# Patient Record
Sex: Female | Born: 1985 | Race: White | Hispanic: No | Marital: Single | Smoking: Current every day smoker
Health system: Southern US, Community
[De-identification: ages and names within clinical notes are randomized; demographics above are authoritative.]

## PROBLEM LIST (undated history)

## (undated) DIAGNOSIS — F909 Attention-deficit hyperactivity disorder, unspecified type: Secondary | ICD-10-CM

## (undated) DIAGNOSIS — F419 Anxiety disorder, unspecified: Secondary | ICD-10-CM

## (undated) DIAGNOSIS — F319 Bipolar disorder, unspecified: Secondary | ICD-10-CM

## (undated) DIAGNOSIS — J45909 Unspecified asthma, uncomplicated: Secondary | ICD-10-CM

## (undated) DIAGNOSIS — F1910 Other psychoactive substance abuse, uncomplicated: Secondary | ICD-10-CM

## (undated) DIAGNOSIS — E7800 Pure hypercholesterolemia, unspecified: Secondary | ICD-10-CM

## (undated) DIAGNOSIS — S149XXA Injury of unspecified nerves of neck, initial encounter: Secondary | ICD-10-CM

## (undated) DIAGNOSIS — G589 Mononeuropathy, unspecified: Secondary | ICD-10-CM

## (undated) HISTORY — DX: Pure hypercholesterolemia, unspecified: E78.00

## (undated) HISTORY — PX: ADENOIDECTOMY: SUR15

## (undated) HISTORY — PX: TUBAL LIGATION: SHX77

## (undated) HISTORY — PX: TONSILLECTOMY: SUR1361

## (undated) HISTORY — DX: Unspecified asthma, uncomplicated: J45.909

## (undated) HISTORY — PX: CYSTECTOMY: SHX5119

## (undated) HISTORY — PX: WISDOM TOOTH EXTRACTION: SHX21

---

## 2016-09-14 ENCOUNTER — Emergency Department (HOSPITAL_COMMUNITY)
Admission: EM | Admit: 2016-09-14 | Discharge: 2016-09-14 | Disposition: A | Discharge status: Home / Self Care | Payer: Self-pay | Attending: Emergency Medicine | Admitting: Emergency Medicine

## 2016-09-14 ENCOUNTER — Encounter (HOSPITAL_COMMUNITY): Payer: Self-pay | Admitting: *Deleted

## 2016-09-14 DIAGNOSIS — Y929 Unspecified place or not applicable: Secondary | ICD-10-CM | POA: Insufficient documentation

## 2016-09-14 DIAGNOSIS — Y9389 Activity, other specified: Secondary | ICD-10-CM | POA: Insufficient documentation

## 2016-09-14 DIAGNOSIS — Y999 Unspecified external cause status: Secondary | ICD-10-CM | POA: Insufficient documentation

## 2016-09-14 DIAGNOSIS — S39012A Strain of muscle, fascia and tendon of lower back, initial encounter: Secondary | ICD-10-CM | POA: Insufficient documentation

## 2016-09-14 DIAGNOSIS — X500XXA Overexertion from strenuous movement or load, initial encounter: Secondary | ICD-10-CM | POA: Insufficient documentation

## 2016-09-14 MED ORDER — HYDROCODONE-ACETAMINOPHEN 5-325 MG PO TABS: 2.0000 | ORAL_TABLET | ORAL | 0 refills | Status: DC | PRN

## 2016-09-14 MED ORDER — CYCLOBENZAPRINE HCL 10 MG PO TABS: 10.0000 mg | ORAL_TABLET | Freq: Every evening | ORAL | 0 refills | Status: DC | PRN

## 2016-09-14 NOTE — Discharge Instructions (Signed)
Rest Try a heating pad Take Ibuprofen 800mg  three times daily for the next week Pain meds and muscle relaxer will make you drowsy. Do not take these medicines and drive or go to work

## 2016-09-14 NOTE — ED Provider Notes (Signed)
[facility]-EMERGENCY DEPT Provider Note   CSN: [id number] Arrival date & time: 09/14/16  1824  By signing my name below, I, Soijett Blue, attest that this documentation has been prepared under the direction and in the presence of Bethel BornKelly Marie Kleo Dungee, PA-C Electronically Signed: Soijett Blue, ED Scribe. 09/14/16. 7:25 PM.   History   Chief Complaint Chief Complaint  Patient presents with  . Back Pain    HPI Sandra Garza is a 30 y.o. female who presents to the Emergency Department complaining of sudden onset, non-radiating, lower back pain onset this morning. Pt notes that she was lifting heavy boxes while moving and she believes that this is the cause of her lower back pain. Pt denies having lower back pain in the past. Pt notes that her lower back pain is worsened with position change, standing, and ambulation. Pt denies alleviating factors for her lower back pain. Pt states that she had neck issues with recommended surgery that she put off due to moving. Pt denies recent falls. She states that she has tried 800 mg ibuprofen with no relief for her symptoms. Pt denies difficulty urinating, numbness, tingling, saddle paresthesia, bowel/bladder incontinence, fever, chills, color change, rash, wound, and any other symptoms.    The history is provided by the patient. No language interpreter was used.    History reviewed. No pertinent past medical history.  There are no active problems to display for this patient.   History reviewed. No pertinent surgical history.  OB History    No data available       Home Medications    Prior to Admission medications   Not on File    Family History No family history on file.  Social History Social History  Substance Use Topics  . Smoking status: Never Smoker  . Smokeless tobacco: Never Used  . Alcohol use No     Allergies   Patient has no allergy information on record.   Review of Systems Review of Systems  Constitutional: Negative  for chills and fever.  Gastrointestinal:       No bowel incontinence  Genitourinary: Negative for difficulty urinating.       No bladder incontinence  Musculoskeletal: Positive for back pain (lower). Negative for gait problem and joint swelling.  Skin: Negative for color change, rash and wound.  Neurological: Negative for numbness.       No tingling     Physical Exam Updated Vital Signs BP 110/79 (BP Location: Right Arm)   Pulse 79   Temp 98.2 F (36.8 C) (Oral)   Resp 18   LMP 09/10/2016   SpO2 95%   Physical Exam  Constitutional: She is oriented to person, place, and time. She appears well-developed and well-nourished. No distress.  HENT:  Head: Normocephalic and atraumatic.  Eyes: EOM are normal.  Neck: Neck supple.  Cardiovascular: Normal rate.   Pulmonary/Chest: Effort normal. No respiratory distress.  Abdominal: She exhibits no distension.  Musculoskeletal: Normal range of motion.       Lumbar back: She exhibits tenderness and bony tenderness.  Inspection: No masses, deformity, or rash Palpation: No cervical or thoracic midline spinal tenderness. No cervical or thoracic paraspinal muscle tenderness. Diffuse lumbar tenderness, midline and paraspinal muscle tenderness.  Strength: 5/5 in lower extremities and normal plantar and dorsiflexion Gait: Normal gait Reflexes: Patellar reflex is 2+ on left and 1+ on right, which pt states is normal for her. Achilles is 2+ bilaterally  Neurological: She is alert and oriented to person, place,  and time.  Skin: Skin is warm and dry.  Psychiatric: She has a normal mood and affect. Her behavior is normal.  Nursing note and vitals reviewed.    ED Treatments / Results  DIAGNOSTIC STUDIES: Oxygen Saturation is 95% on RA, adequate by my interpretation.    COORDINATION OF CARE: 7:18 PM Discussed treatment plan with pt at bedside which includes norco Rx, flexeril Rx and pt agreed to plan.   Procedures Procedures (including  critical care time)  Medications Ordered in ED Medications - No data to display   Initial Impression / Assessment and Plan / ED Course  I have reviewed the triage vital signs and the nursing notes.  Clinical Course    Patient with back pain.  No neurological deficits and normal neuro exam.  Patient is ambulatory.  No loss of bowel or bladder control.  No concern for cauda equina.  No fever, night sweats, weight loss, h/o cancer, IVDA, no recent procedure to back. No urinary symptoms suggestive of UTI. Pt will be discharged home with norco Rx and flexeril Rx. Supportive care and return precaution discussed. Appears safe for discharge at this time. Follow up as indicated in discharge paperwork.   Final Clinical Impressions(s) / ED Diagnoses   Final diagnoses:  Strain of lumbar region, initial encounter    New Prescriptions Discharge Medication List as of 09/14/2016  7:21 PM    START taking these medications   Details  cyclobenzaprine (FLEXERIL) 10 MG tablet Take 1 tablet (10 mg total) by mouth at bedtime and may repeat dose one time if needed., Starting Tue 09/14/2016, Print    HYDROcodone-acetaminophen (NORCO/VICODIN) 5-325 MG tablet Take 2 tablets by mouth every 4 (four) hours as needed., Starting Tue 09/14/2016, Print       I personally performed the services described in this documentation, which was scribed in my presence. The recorded information has been reviewed and is accurate.     Bethel BornKelly Marie Lillionna Nabi, PA-C 09/15/16 1124    Donnetta HutchingBrian Cook, MD 09/17/16 [phone]

## 2016-09-14 NOTE — ED Triage Notes (Signed)
Pt complains of lower back pain since waking up this morning. Pt states she recently moved and has been moving boxes since Friday. Pt denies urinary symptoms.

## 2016-09-27 ENCOUNTER — Encounter (HOSPITAL_COMMUNITY): Payer: Self-pay | Admitting: Emergency Medicine

## 2016-09-27 ENCOUNTER — Emergency Department (HOSPITAL_COMMUNITY)
Admission: EM | Admit: 2016-09-27 | Discharge: 2016-09-27 | Disposition: A | Discharge status: Home / Self Care | Payer: Self-pay | Attending: Emergency Medicine | Admitting: Emergency Medicine

## 2016-09-27 DIAGNOSIS — F909 Attention-deficit hyperactivity disorder, unspecified type: Secondary | ICD-10-CM | POA: Insufficient documentation

## 2016-09-27 DIAGNOSIS — Z760 Encounter for issue of repeat prescription: Secondary | ICD-10-CM | POA: Insufficient documentation

## 2016-09-27 DIAGNOSIS — Z79899 Other long term (current) drug therapy: Secondary | ICD-10-CM | POA: Insufficient documentation

## 2016-09-27 DIAGNOSIS — F17200 Nicotine dependence, unspecified, uncomplicated: Secondary | ICD-10-CM | POA: Insufficient documentation

## 2016-09-27 HISTORY — DX: Attention-deficit hyperactivity disorder, unspecified type: F90.9

## 2016-09-27 HISTORY — DX: Bipolar disorder, unspecified: F31.9

## 2016-09-27 HISTORY — DX: Injury of unspecified nerves of neck, initial encounter: S14.9XXA

## 2016-09-27 HISTORY — DX: Anxiety disorder, unspecified: F41.9

## 2016-09-27 HISTORY — DX: Mononeuropathy, unspecified: G58.9

## 2016-09-27 NOTE — ED Triage Notes (Signed)
Pt stated that she ran out of her behavioral health medications yesterday

## 2016-09-27 NOTE — Discharge Instructions (Signed)
[facility] for help with establishing care with a new psychiatrist. Please return to the emergency department if you develop any new or worsening symptoms.

## 2016-09-27 NOTE — ED Provider Notes (Signed)
[facility]PT Provider Note   CSN: [id number] Arrival date & time: 09/27/16  [id number]     History   Chief Complaint Chief Complaint  Patient presents with  . Medication Refill    HPI Sandra Garza is a 30 y.o. female with history of ADHD, bipolar 1, anxiety who presents for medication refill of her psychiatric medications. Patient reports she recently moved from [place] and does not have insurance in [place] yet. Patient reports that her psychiatrist in [place] can only prescribed 3 of her medications across state lines, and they are not the most important medications to keep her level. Patient ran out of her medications yesterday. She has a symptomatic. She denies SI, HI, AVH. Patient also denies any chest pain, shortness of breath, abdominal pain, nausea, vomiting, urinary symptoms.  HPI  Past Medical History:  Diagnosis Date  . ADHD   . Anxiety   . Bipolar 1 disorder, depressed (HCC)   . Pinched nerve in neck    C 6-7    There are no active problems to display for this patient.   Past Surgical History:  Procedure Laterality Date  . CESAREAN SECTION    . CYSTECTOMY     on overy  . TUBAL LIGATION      OB History    No data available       Home Medications    Prior to Admission medications   Medication Sig Start Date End Date Taking? Authorizing Provider  ALPRAZolam Prudy Feeler(XANAX) 1 MG tablet Take 1 mg by mouth 2 (two) times daily as needed for anxiety.   Yes Historical Provider, MD  amphetamine-dextroamphetamine (ADDERALL XR) 20 MG 24 hr capsule Take 40 mg by mouth daily.   Yes Historical Provider, MD  ARIPiprazole (ABILIFY) 5 MG tablet Take 5 mg by mouth daily.   Yes Historical Provider, MD  cyclobenzaprine (FLEXERIL) 10 MG tablet Take 1 tablet (10 mg total) by mouth at bedtime and may repeat dose one time if needed. 09/14/16  Yes Bethel BornKelly Marie Gekas, PA-C  FLUoxetine (PROZAC) 20 MG tablet Take 20 mg by mouth daily.   Yes Historical Provider, MD  topiramate (TOPAMAX)  15 MG capsule Take 15 mg by mouth 2 (two) times daily.   Yes Historical Provider, MD  HYDROcodone-acetaminophen (NORCO/VICODIN) 5-325 MG tablet Take 2 tablets by mouth every 4 (four) hours as needed. Patient not taking: Reported on 09/27/2016 09/14/16   Bethel BornKelly Marie Gekas, PA-C    Family History History reviewed. No pertinent family history.  Social History Social History  Substance Use Topics  . Smoking status: Current Every Day Smoker    Packs/day: 0.50  . Smokeless tobacco: Never Used  . Alcohol use Yes     Allergies   Ceclor [cefaclor]   Review of Systems Review of Systems  Constitutional: Negative for chills and fever.  HENT: Negative for facial swelling and sore throat.   Respiratory: Negative for shortness of breath.   Cardiovascular: Negative for chest pain.  Gastrointestinal: Negative for abdominal pain, nausea and vomiting.  Genitourinary: Negative for dysuria.  Musculoskeletal: Negative for back pain.  Skin: Negative for rash and wound.  Neurological: Negative for headaches.  Psychiatric/Behavioral: Negative for hallucinations and suicidal ideas. The patient is not nervous/anxious.      Physical Exam Updated Vital Signs BP 111/82 (BP Location: Right Arm)   Pulse 87   Temp 98.2 F (36.8 C) (Oral)   Resp 18   Wt 104.3 kg   LMP 09/10/2016   SpO2 93%  Physical Exam  Constitutional: She appears well-developed and well-nourished. No distress.  HENT:  Head: Normocephalic and atraumatic.  Mouth/Throat: Oropharynx is clear and moist. No oropharyngeal exudate.  Eyes: Conjunctivae are normal. Pupils are equal, round, and reactive to light. Right eye exhibits no discharge. Left eye exhibits no discharge. No scleral icterus.  Neck: Normal range of motion. Neck supple. No thyromegaly present.  Cardiovascular: Normal rate, regular rhythm, normal heart sounds and intact distal pulses.  Exam reveals no gallop and no friction rub.   No murmur heard. Pulmonary/Chest:  Effort normal and breath sounds normal. No stridor. No respiratory distress. She has no wheezes. She has no rales.  Abdominal: Soft. Bowel sounds are normal. She exhibits no distension. There is no tenderness. There is no rebound and no guarding.  Musculoskeletal: She exhibits no edema.  Lymphadenopathy:    She has no cervical adenopathy.  Neurological: She is alert. Coordination normal.  Skin: Skin is warm and dry. No rash noted. She is not diaphoretic. No pallor.  Psychiatric: She has a normal mood and affect.  Nursing note and vitals reviewed.    ED Treatments / Results  Labs (all labs ordered are listed, but only abnormal results are displayed) Labs Reviewed - No data to display  EKG  EKG Interpretation None       Radiology No results found.  Procedures Procedures (including critical care time)  Medications Ordered in ED Medications - No data to display   Initial Impression / Assessment and Plan / ED Course  I have reviewed the triage vital signs and the nursing notes.  Pertinent labs & imaging results that were available during my care of the patient were reviewed by me and considered in my medical decision making (see chart for details).  Clinical Course     Patient presented for medication refill. Patient is symptomatic. Considering these medications are psychiatric and many are controlled substances, I told patient that we cannot refill them in the ED. I gave the patient resources for behavioral health in the area. She understands and agrees with plan. Patient vitals stable throughout ED course and discharged in satisfactory condition. I discussed patient case with Dr. Freida BusmanAllen who guided the patient's management and agrees with plan.  Final Clinical Impressions(s) / ED Diagnoses   Final diagnoses:  Encounter for medication refill    New Prescriptions New Prescriptions   No medications on file     Emi Holeslexandra M Llewelyn Sheaffer, Cordelia Poche-C 09/27/16 1148    Lorre NickAnthony Allen,  MD 09/29/16 1022

## 2016-12-15 ENCOUNTER — Encounter (HOSPITAL_COMMUNITY): Payer: Self-pay | Admitting: Emergency Medicine

## 2016-12-15 ENCOUNTER — Emergency Department (HOSPITAL_COMMUNITY)
Admission: EM | Admit: 2016-12-15 | Discharge: 2016-12-15 | Disposition: A | Discharge status: Home / Self Care | Payer: Medicaid Other | Attending: Emergency Medicine | Admitting: Emergency Medicine

## 2016-12-15 DIAGNOSIS — F17200 Nicotine dependence, unspecified, uncomplicated: Secondary | ICD-10-CM | POA: Insufficient documentation

## 2016-12-15 DIAGNOSIS — M62838 Other muscle spasm: Secondary | ICD-10-CM | POA: Insufficient documentation

## 2016-12-15 DIAGNOSIS — F909 Attention-deficit hyperactivity disorder, unspecified type: Secondary | ICD-10-CM | POA: Diagnosis not present

## 2016-12-15 DIAGNOSIS — M542 Cervicalgia: Secondary | ICD-10-CM | POA: Diagnosis present

## 2016-12-15 MED ORDER — KETOROLAC TROMETHAMINE 30 MG/ML IJ SOLN
30.0000 mg | Freq: Once | INTRAMUSCULAR | Status: AC
  Administered 2016-12-15: 30 mg via INTRAMUSCULAR
  Filled 2016-12-15: qty 1

## 2016-12-15 MED ORDER — NAPROXEN 500 MG PO TABS: 500.0000 mg | ORAL_TABLET | Freq: Two times a day (BID) | ORAL | 0 refills | Status: DC

## 2016-12-15 MED ORDER — DIAZEPAM 2 MG PO TABS
2.0000 mg | ORAL_TABLET | Freq: Once | ORAL | Status: AC
  Administered 2016-12-15: 2 mg via ORAL
  Filled 2016-12-15: qty 1

## 2016-12-15 NOTE — ED Notes (Signed)
ED Provider at bedside. 

## 2016-12-15 NOTE — ED Provider Notes (Signed)
[facility]PT Provider Note   CSN: [id number] Arrival date & time: 12/15/16  1902   By signing my name below, I, Clarisse Gouge, attest that this documentation has been prepared under the direction and in the presence of Milly Jakob, PA-C. Electronically Signed: Clarisse Gouge, Scribe. 12/15/16. 9:39 PM.   History   Chief Complaint Chief Complaint  Patient presents with  . Neck Pain   The history is provided by the patient and medical records. No language interpreter was used.    HPI Comments: Sandra Garza is a 31 y.o. female who presents to the Emergency Department complaining of progressively worsening right neck pain  x 4 days. She states the neck pain is causing a headache and she adds associated bilateral forearm tingling. Pt reports Hx of a pinched nerve at C6 or C7. She states she was seen at a UC ~2-3 days prior to evaluation, where she was given robaxin for her pain without relief. Pt denies injury to the neck, fever, numbness, weakness, head injury, SOB, nausea, vomiting and diarrhea. No PCP noted.  Past Medical History:  Diagnosis Date  . ADHD   . Anxiety   . Bipolar 1 disorder, depressed (HCC)   . Pinched nerve in neck    C 6-7    There are no active problems to display for this patient.   Past Surgical History:  Procedure Laterality Date  . CESAREAN SECTION    . CYSTECTOMY     on overy  . TUBAL LIGATION      OB History    No data available       Home Medications    Prior to Admission medications   Medication Sig Start Date End Date Taking? Authorizing Provider  ALPRAZolam Prudy Feeler) 1 MG tablet Take 1 mg by mouth 2 (two) times daily as needed for anxiety.    Historical Provider, MD  amphetamine-dextroamphetamine (ADDERALL XR) 20 MG 24 hr capsule Take 40 mg by mouth daily.    Historical Provider, MD  ARIPiprazole (ABILIFY) 5 MG tablet Take 5 mg by mouth daily.    Historical Provider, MD  cyclobenzaprine (FLEXERIL) 10 MG tablet Take 1 tablet (10  mg total) by mouth at bedtime and may repeat dose one time if needed. 09/14/16   Bethel Born, PA-C  FLUoxetine (PROZAC) 20 MG tablet Take 20 mg by mouth daily.    Historical Provider, MD  HYDROcodone-acetaminophen (NORCO/VICODIN) 5-325 MG tablet Take 2 tablets by mouth every 4 (four) hours as needed. Patient not taking: Reported on 09/27/2016 09/14/16   Bethel Born, PA-C  naproxen (NAPROSYN) 500 MG tablet Take 1 tablet (500 mg total) by mouth 2 (two) times daily with a meal. 12/15/16   Everlene Farrier, PA-C  topiramate (TOPAMAX) 15 MG capsule Take 15 mg by mouth 2 (two) times daily.    Historical Provider, MD    Family History No family history on file.  Social History Social History  Substance Use Topics  . Smoking status: Current Every Day Smoker    Packs/day: 0.50  . Smokeless tobacco: Never Used  . Alcohol use Yes     Allergies   Ceclor [cefaclor]   Review of Systems Review of Systems  Constitutional: Negative for fever.  HENT: Negative for ear pain.   Eyes: Negative for pain and visual disturbance.  Respiratory: Negative for shortness of breath.   Cardiovascular: Negative for chest pain.  Gastrointestinal: Negative for diarrhea, nausea and vomiting.  Genitourinary: Negative for enuresis.  Musculoskeletal: Positive for  arthralgias and neck pain. Negative for neck stiffness.  Skin: Negative for rash and wound.  Neurological: Positive for headaches. Negative for weakness, light-headedness and numbness.     Physical Exam Updated Vital Signs BP 112/91 (BP Location: Left Arm)   Pulse 88   Temp 98.1 F (36.7 C) (Oral)   Resp 16   Ht 5\' 4"  (1.626 m)   Wt 230 lb (104.3 kg)   LMP 12/14/2016   SpO2 98%   BMI 39.48 kg/m   Physical Exam  Constitutional: She is oriented to person, place, and time. She appears well-developed and well-nourished. No distress.  HENT:  Head: Normocephalic and atraumatic.  Eyes: Conjunctivae are normal. Pupils are equal, round, and  reactive to light. Right eye exhibits no discharge. Left eye exhibits no discharge.  Neck: Neck supple. No JVD present. No tracheal deviation present.  Neck is supple. No midline neck TTP. Patient is able to place her chin to her chest and look up to the ceiling. She is able to rotate her head greater than 45 in each direction. No meningeal signs. She has tenderness over her right trapezius musculature which feels to be in spasm.   Cardiovascular: Normal rate, regular rhythm, normal heart sounds and intact distal pulses.   Pulmonary/Chest: Effort normal and breath sounds normal. No stridor. No respiratory distress. She has no wheezes. She has no rales.  Abdominal: Soft. There is no tenderness.  Musculoskeletal: Normal range of motion. She exhibits no tenderness.  Good grip strengths bilaterally. Strength bilateral upper extremities. No weakness noted.  Lymphadenopathy:    She has no cervical adenopathy.  Neurological: She is alert and oriented to person, place, and time. No cranial nerve deficit or sensory deficit. Coordination normal.  Sensation is intact her bilateral upper extremities.  Skin: Skin is warm and dry. Capillary refill takes less than 2 seconds. No rash noted. She is not diaphoretic. No erythema. No pallor.  Psychiatric: She has a normal mood and affect. Her behavior is normal.  Nursing note and vitals reviewed.    ED Treatments / Results  DIAGNOSTIC STUDIES: Oxygen Saturation is 98% on RA, normal by my interpretation.    COORDINATION OF CARE: 9:35 PM Discussed treatment plan with pt at bedside and pt agreed to plan. Will order pain medications and Rx for further pain medications.  Labs (all labs ordered are listed, but only abnormal results are displayed) Labs Reviewed - No data to display  EKG  EKG Interpretation None       Radiology No results found.  Procedures Procedures (including critical care time)  Medications Ordered in ED Medications  ketorolac  (TORADOL) 30 MG/ML injection 30 mg (not administered)  diazepam (VALIUM) tablet 2 mg (not administered)     Initial Impression / Assessment and Plan / ED Course  I have reviewed the triage vital signs and the nursing notes.  Pertinent labs & imaging results that were available during my care of the patient were reviewed by me and considered in my medical decision making (see chart for details).    This is a 31 y.o. female who presents to the Emergency Department complaining of progressively worsening right neck pain  x 4 days. She denies any injury. She is neurovascularly intact. She is able to place her chin to her chest and look up to the ceiling. She is able to rotate her head greater than 45 in each direction. No meningeal signs. She is tender over her right trapezius musculature. Suspect neck muscle spasm.  Better with a shot of Toradol and tablet of Valium in the emergency department. We'll discharge with prescription for Naprosyn. I encouraged her to follow-up closely with primary care and discussed return precautions. I advised the patient to follow-up with their primary care provider this week. I advised the patient to return to the emergency department with new or worsening symptoms or new concerns. The patient verbalized understanding and agreement with plan.    I personally performed the services described in this documentation, which was scribed in my presence. The recorded information has been reviewed and is accurate.      Final Clinical Impressions(s) / ED Diagnoses   Final diagnoses:  Muscle spasms of neck    New Prescriptions New Prescriptions   NAPROXEN (NAPROSYN) 500 MG TABLET    Take 1 tablet (500 mg total) by mouth 2 (two) times daily with a meal.     Everlene FarrierWilliam Ma Munoz, PA-C 12/16/16 [id number]    Linwood DibblesJon Knapp, MD 12/17/16 2124

## 2016-12-15 NOTE — ED Triage Notes (Signed)
Pt c/o right sided neck pain causing her a headache starting 4 days ago. States she has a pinched nerve. [facility] relief.

## 2016-12-15 NOTE — ED Notes (Signed)
Patient was alert, oriented and stable upon discharge. RN went over AVS and patient had no further questions. Pt was advised not to drive on valium given prior to discharge.

## 2017-06-14 ENCOUNTER — Emergency Department (HOSPITAL_COMMUNITY)
Admission: EM | Admit: 2017-06-14 | Discharge: 2017-06-14 | Disposition: A | Discharge status: Home / Self Care | Payer: Medicaid Other | Attending: Emergency Medicine | Admitting: Emergency Medicine

## 2017-06-14 ENCOUNTER — Encounter (HOSPITAL_COMMUNITY): Payer: Self-pay | Admitting: Emergency Medicine

## 2017-06-14 DIAGNOSIS — K0889 Other specified disorders of teeth and supporting structures: Secondary | ICD-10-CM | POA: Insufficient documentation

## 2017-06-14 DIAGNOSIS — Z79899 Other long term (current) drug therapy: Secondary | ICD-10-CM | POA: Diagnosis not present

## 2017-06-14 DIAGNOSIS — F17200 Nicotine dependence, unspecified, uncomplicated: Secondary | ICD-10-CM | POA: Diagnosis not present

## 2017-06-14 MED ORDER — AMOXICILLIN-POT CLAVULANATE 875-125 MG PO TABS: 1.0000 | ORAL_TABLET | Freq: Two times a day (BID) | ORAL | 0 refills | Status: DC

## 2017-06-14 NOTE — ED Notes (Signed)
Bed: WTR5 Expected date:  Expected time:  Means of arrival:  Comments: 

## 2017-06-14 NOTE — ED Triage Notes (Signed)
Toothache on r/lower jaw x 6 days

## 2017-06-14 NOTE — Discharge Instructions (Signed)

## 2017-06-14 NOTE — ED Provider Notes (Signed)
[facility]-EMERGENCY DEPT Provider Note   CSN: [id number] Arrival date & time: 06/14/17  0865  By signing my name below, I, Sandra Garza, attest that this documentation has been prepared under the direction and in the presence of Lyndel Safe PA-C.  Electronically Signed: Vista Garza, ED Scribe. 06/14/17. 11:46 AM.   History   Chief Complaint Chief Complaint  Patient presents with  . Dental Pain    HPI HPI Comments: Sandra Garza is a 31 y.o. female who presents to the Emergency Department complaining of worsening right lower dental pain that started 5 days ago. Pt is requesting an antibiotic because she feels like she has an infection in a rear molar on the right side. Pt has an appointment with a dentist in one month. Pt has tried ibuprofen and salt water rinses with minimal relief. She has not taken any ibuprofen today. No known allergies to abx. She has not taken any abx within the past month. No difficulty swallowing. No fever or chills.  The history is provided by the patient. No language interpreter was used.    Past Medical History:  Diagnosis Date  . ADHD   . Anxiety   . Bipolar 1 disorder, depressed (HCC)   . Pinched nerve in neck    C 6-7    There are no active problems to display for this patient.   Past Surgical History:  Procedure Laterality Date  . CESAREAN SECTION    . CYSTECTOMY     on overy  . TONSILLECTOMY    . TUBAL LIGATION    . WISDOM TOOTH EXTRACTION      OB History    No data available       Home Medications    Prior to Admission medications   Medication Sig Start Date End Date Taking? Authorizing Provider  ALPRAZolam Prudy Feeler) 1 MG tablet Take 1 mg by mouth 2 (two) times daily as needed for anxiety.    [provider]  amoxicillin-clavulanate (AUGMENTIN) 875-125 MG tablet Take 1 tablet by mouth every 12 (twelve) hours. 06/14/17   Cristina Gong, PA-C  amphetamine-dextroamphetamine (ADDERALL XR) 20 MG 24 hr capsule Take 40 mg  by mouth daily.    [provider]  ARIPiprazole (ABILIFY) 5 MG tablet Take 5 mg by mouth daily.    [provider]  cyclobenzaprine (FLEXERIL) 10 MG tablet Take 1 tablet (10 mg total) by mouth at bedtime and may repeat dose one time if needed. 09/14/16   Bethel Born, PA-C  FLUoxetine (PROZAC) 20 MG tablet Take 20 mg by mouth daily.    [provider]  HYDROcodone-acetaminophen (NORCO/VICODIN) 5-325 MG tablet Take 2 tablets by mouth every 4 (four) hours as needed. Patient not taking: Reported on 09/27/2016 09/14/16   Bethel Born, PA-C  naproxen (NAPROSYN) 500 MG tablet Take 1 tablet (500 mg total) by mouth 2 (two) times daily with a meal. 12/15/16   Everlene Farrier, PA-C  topiramate (TOPAMAX) 15 MG capsule Take 15 mg by mouth 2 (two) times daily.    [provider]    Family History History reviewed. No pertinent family history.  Social History Social History  Substance Use Topics  . Smoking status: Current Every Day Smoker    Packs/day: 0.50  . Smokeless tobacco: Never Used  . Alcohol use Yes     Comment: occ     Allergies   Ceclor [cefaclor]   Review of Systems Review of Systems  Constitutional: Negative for chills and fever.  HENT: Positive for dental problem. Negative for drooling and trouble swallowing.   Respiratory: Negative for cough and shortness of breath.   Neurological: Negative for headaches.  All other systems reviewed and are negative.    Physical Exam Updated Vital Signs BP 131/88 (BP Location: Left Arm)   Pulse 80   Temp 97.7 F (36.5 C) (Oral)   Resp 16   Ht 5\' 4"  (1.626 m)   Wt 104.3 kg (230 lb)   LMP 05/29/2017   SpO2 96%   BMI 39.48 kg/m   Physical Exam  Constitutional: She is oriented to person, place, and time. She appears well-developed and well-nourished. No distress.  HENT:  Head: Normocephalic and atraumatic.  Multiple teeth with cavities in various stages of decay. No apparent dental  fractures or pulp showing. No obvious dental abscess or abnormal swelling. No trismus. No oropharyngeal edema or erythema. No elevation of the floor of the mouth.   Neck: Normal range of motion. Neck supple.  Full ROM.   Pulmonary/Chest: Effort normal. No stridor.  Lymphadenopathy:    She has no cervical adenopathy.  Neurological: She is alert and oriented to person, place, and time.  Skin: Skin is warm and dry. She is not diaphoretic.  Psychiatric: She has a normal mood and affect. Judgment normal.  Nursing note and vitals reviewed.    ED Treatments / Results  DIAGNOSTIC STUDIES: Oxygen Saturation is 97% on RA, normal by my interpretation.  COORDINATION OF CARE: 11:46 AM-Discussed treatment plan with pt at bedside and pt agreed to plan.   Labs (all labs ordered are listed, but only abnormal results are displayed) Labs Reviewed - No data to display  EKG  EKG Interpretation None       Radiology No results found.  Procedures Procedures (including critical care time)  Medications Ordered in ED Medications - No data to display   Initial Impression / Assessment and Plan / ED Course  I have reviewed the triage vital signs and the nursing notes.  Pertinent labs & imaging results that were available during my care of the patient were reviewed by me and considered in my medical decision making (see chart for details).    Patient with toothache.  No gross abscess.  Exam unconcerning for Ludwig's angina or spread of infection.  Will treat with augmentin and instructions on OTC pain medication.  Patient is allergic to Ceclor, however reports she has taken Augmentin and penicillins in the past with out any rections.  Patient given return precautions.  Urged patient to follow-up with dentist.     Final Clinical Impressions(s) / ED Diagnoses   Final diagnoses:  Pain, dental    New Prescriptions Discharge Medication List as of 06/14/2017 11:51 AM    START taking these  medications   Details  amoxicillin-clavulanate (AUGMENTIN) 875-125 MG tablet Take 1 tablet by mouth every 12 (twelve) hours., Starting Tue 06/14/2017, Print      I personally performed the services described in this documentation, which was scribed in my presence. The recorded information has been reviewed and is accurate.     Norman ClayHammond, Avaley Coop W, PA-C 06/14/17 1207    Rolland PorterJames, Mark, MD 06/16/17 [phone]

## 2017-07-10 ENCOUNTER — Emergency Department (HOSPITAL_COMMUNITY)
Admission: EM | Admit: 2017-07-10 | Discharge: 2017-07-10 | Disposition: A | Discharge status: Home / Self Care | Payer: Medicaid Other | Attending: Emergency Medicine | Admitting: Emergency Medicine

## 2017-07-10 ENCOUNTER — Encounter (HOSPITAL_COMMUNITY): Payer: Self-pay | Admitting: Emergency Medicine

## 2017-07-10 DIAGNOSIS — F909 Attention-deficit hyperactivity disorder, unspecified type: Secondary | ICD-10-CM | POA: Diagnosis not present

## 2017-07-10 DIAGNOSIS — Z79899 Other long term (current) drug therapy: Secondary | ICD-10-CM | POA: Diagnosis not present

## 2017-07-10 DIAGNOSIS — F17210 Nicotine dependence, cigarettes, uncomplicated: Secondary | ICD-10-CM | POA: Diagnosis not present

## 2017-07-10 DIAGNOSIS — K029 Dental caries, unspecified: Secondary | ICD-10-CM | POA: Diagnosis not present

## 2017-07-10 DIAGNOSIS — K0889 Other specified disorders of teeth and supporting structures: Secondary | ICD-10-CM | POA: Diagnosis present

## 2017-07-10 DIAGNOSIS — K047 Periapical abscess without sinus: Secondary | ICD-10-CM

## 2017-07-10 MED ORDER — NAPROXEN 500 MG PO TABS: 500.0000 mg | ORAL_TABLET | Freq: Two times a day (BID) | ORAL | 0 refills | Status: DC

## 2017-07-10 MED ORDER — HYDROCODONE-ACETAMINOPHEN 5-325 MG PO TABS
1.0000 | ORAL_TABLET | Freq: Once | ORAL | Status: AC
Start: 2017-07-10 — End: 2017-07-10
  Administered 2017-07-10: 1 via ORAL
  Filled 2017-07-10: qty 1

## 2017-07-10 MED ORDER — CLINDAMYCIN HCL 300 MG PO CAPS
300.0000 mg | ORAL_CAPSULE | Freq: Once | ORAL | Status: AC
  Administered 2017-07-10: 300 mg via ORAL
  Filled 2017-07-10: qty 1

## 2017-07-10 MED ORDER — CLINDAMYCIN HCL 300 MG PO CAPS: 300.0000 mg | ORAL_CAPSULE | Freq: Three times a day (TID) | ORAL | 0 refills | Status: DC

## 2017-07-10 NOTE — Discharge Instructions (Signed)
Follow up with the dentis this week as scheduled. Return here as needed.

## 2017-07-10 NOTE — ED Triage Notes (Signed)
Pt reports dental pain x1 month. Pain in r/lower side of mouth

## 2017-07-10 NOTE — ED Provider Notes (Signed)
[facility]-EMERGENCY DEPT Provider Note   CSN: [id number] Arrival date & time: 07/10/17  1249     History   Chief Complaint No chief complaint on file.   HPI Sandra Garza is a 31 y.o. female who presents to the ED with dental pain. The pain is located in the lower right dental area. Patient reports the pain has been off and on for the past month. She has an appointment with a dentist this week but the pain was to bad today to wait.  The history is provided by the patient. No language interpreter was used.  Dental Pain   This is a new problem. The current episode started more than 1 week ago. The problem occurs constantly. The problem has been gradually worsening. The pain is at a severity of 10/10. She has tried acetaminophen for the symptoms. The treatment provided no relief.    Past Medical History:  Diagnosis Date  . ADHD   . Anxiety   . Bipolar 1 disorder, depressed (HCC)   . Pinched nerve in neck    C 6-7    There are no active problems to display for this patient.   Past Surgical History:  Procedure Laterality Date  . CESAREAN SECTION    . CYSTECTOMY     on overy  . TONSILLECTOMY    . TUBAL LIGATION    . WISDOM TOOTH EXTRACTION      OB History    No data available       Home Medications    Prior to Admission medications   Medication Sig Start Date End Date Taking? Authorizing Provider  ALPRAZolam Prudy Feeler) 1 MG tablet Take 1 mg by mouth 2 (two) times daily as needed for anxiety.    [provider]  amoxicillin-clavulanate (AUGMENTIN) 875-125 MG tablet Take 1 tablet by mouth every 12 (twelve) hours. 06/14/17   Cristina Gong, PA-C  amphetamine-dextroamphetamine (ADDERALL XR) 20 MG 24 hr capsule Take 40 mg by mouth daily.    [provider]  ARIPiprazole (ABILIFY) 5 MG tablet Take 5 mg by mouth daily.    [provider]  clindamycin (CLEOCIN) 300 MG capsule Take 1 capsule (300 mg total) by mouth 3 (three) times daily. 07/10/17    Janne Napoleon, NP  cyclobenzaprine (FLEXERIL) 10 MG tablet Take 1 tablet (10 mg total) by mouth at bedtime and may repeat dose one time if needed. 09/14/16   Bethel Born, PA-C  FLUoxetine (PROZAC) 20 MG tablet Take 20 mg by mouth daily.    [provider]  naproxen (NAPROSYN) 500 MG tablet Take 1 tablet (500 mg total) by mouth 2 (two) times daily. 07/10/17   Janne Napoleon, NP  topiramate (TOPAMAX) 15 MG capsule Take 15 mg by mouth 2 (two) times daily.    [provider]    Family History No family history on file.  Social History Social History  Substance Use Topics  . Smoking status: Current Every Day Smoker    Packs/day: 0.50  . Smokeless tobacco: Never Used  . Alcohol use Yes     Comment: occ     Allergies   Ceclor [cefaclor]   Review of Systems Review of Systems  Constitutional: Negative for chills. Fever: ?  HENT: Positive for sore throat. Negative for drooling.   Respiratory: Positive for cough (Every day smoker).   Gastrointestinal: Negative for vomiting.  Musculoskeletal: Negative for back pain.  Skin: Negative for rash.  Neurological: Positive for headaches.  Physical Exam Updated Vital Signs There were no vitals taken for this visit.  Physical Exam  Constitutional: She is oriented to person, place, and time. She appears well-developed and well-nourished. No distress.  HENT:  Head: Normocephalic.  Right Ear: Tympanic membrane normal.  Left Ear: Tympanic membrane normal.  Nose: Nose normal.  Mouth/Throat: Oropharynx is clear and moist. Dental caries present. No dental abscesses.    Right lower first molar with large decayed area, tender on exam.   Eyes: EOM are normal.  Neck: Neck supple.  Cardiovascular: Normal rate and regular rhythm.   Pulmonary/Chest: Effort normal. She has no wheezes.  Abdominal: Soft. There is no tenderness.  Musculoskeletal: Normal range of motion.  Lymphadenopathy:    She has cervical adenopathy.    Neurological: She is alert and oriented to person, place, and time. No cranial nerve deficit.  Skin: Skin is warm and dry.  Psychiatric: She has a normal mood and affect. Her behavior is normal.  Nursing note and vitals reviewed.    ED Treatments / Results  Labs (all labs ordered are listed, but only abnormal results are displayed) Labs Reviewed - No data to display  Radiology No results found.  Procedures Procedures (including critical care time)  Medications Ordered in ED Medications  HYDROcodone-acetaminophen (NORCO/VICODIN) 5-325 MG per tablet 1 tablet (not administered)  clindamycin (CLEOCIN) capsule 300 mg (not administered)     Initial Impression / Assessment and Plan / ED Course  I have reviewed the triage vital signs and the nursing notes.  Patient with toothache.  No gross abscess.  Exam unconcerning for Ludwig's angina or spread of infection.  Will treat with penicillin and anti-inflammatories medicine.  Urged patient to follow-up with dentist as scheduled this week.  Final Clinical Impressions(s) / ED Diagnoses   Final diagnoses:  Infected dental caries    New Prescriptions New Prescriptions   CLINDAMYCIN (CLEOCIN) 300 MG CAPSULE    Take 1 capsule (300 mg total) by mouth 3 (three) times daily.   NAPROXEN (NAPROSYN) 500 MG TABLET    Take 1 tablet (500 mg total) by mouth 2 (two) times daily.     Kerrie Buffaloeese, Hope ConcordM, [place] 07/10/17 1435    Doug SouJacubowitz, Sam, MD 07/10/17 [phone]

## 2017-09-10 ENCOUNTER — Encounter (HOSPITAL_COMMUNITY): Payer: Self-pay | Admitting: Emergency Medicine

## 2017-09-10 ENCOUNTER — Emergency Department (HOSPITAL_COMMUNITY): Payer: Medicaid Other

## 2017-09-10 ENCOUNTER — Emergency Department (HOSPITAL_COMMUNITY)
Admission: EM | Admit: 2017-09-10 | Discharge: 2017-09-10 | Disposition: A | Discharge status: Home / Self Care | Payer: Medicaid Other | Attending: Emergency Medicine | Admitting: Emergency Medicine

## 2017-09-10 DIAGNOSIS — M62838 Other muscle spasm: Secondary | ICD-10-CM | POA: Insufficient documentation

## 2017-09-10 DIAGNOSIS — Z79899 Other long term (current) drug therapy: Secondary | ICD-10-CM | POA: Diagnosis not present

## 2017-09-10 DIAGNOSIS — M436 Torticollis: Secondary | ICD-10-CM | POA: Diagnosis not present

## 2017-09-10 DIAGNOSIS — M25511 Pain in right shoulder: Secondary | ICD-10-CM | POA: Diagnosis present

## 2017-09-10 DIAGNOSIS — F17210 Nicotine dependence, cigarettes, uncomplicated: Secondary | ICD-10-CM | POA: Diagnosis not present

## 2017-09-10 MED ORDER — CYCLOBENZAPRINE HCL 10 MG PO TABS
10.0000 mg | ORAL_TABLET | Freq: Two times a day (BID) | ORAL | 0 refills | Status: DC | PRN
Start: 2017-09-10 — End: 2017-12-26

## 2017-09-10 MED ORDER — PREDNISONE 20 MG PO TABS
60.0000 mg | ORAL_TABLET | Freq: Once | ORAL | Status: AC
  Administered 2017-09-10: 60 mg via ORAL
  Filled 2017-09-10: qty 3

## 2017-09-10 MED ORDER — PREDNISONE 50 MG PO TABS: ORAL_TABLET | ORAL | 0 refills | Status: DC

## 2017-09-10 NOTE — ED Triage Notes (Signed)
Patient here from home with complaints of collar bone pain. Denies trauma, states that she did house work last weekend and woke up on Monday with pain. Hx of pinch nerve in neck.

## 2017-09-10 NOTE — ED Notes (Signed)
Bed: WTR8 Expected date:  Expected time:  Means of arrival:  Comments: 

## 2017-09-10 NOTE — ED Provider Notes (Signed)
[facility]-EMERGENCY DEPT Provider Note   CSN: [id number] Arrival date & time: 09/10/17  1119     History   Chief Complaint Chief Complaint  Patient presents with  . Shoulder Pain    HPI Sandra Garza is a 31 y.o. female who presents to the ED with shoulder pain. The pain in located in the right the right side of the neck and goes to the clavicle. The onset was sudden. Patient woke up 5 days ago and noted the pain. She has taken ibuprofen without relief. The pain increases with movement of the right shoulder and neck. Patient denies any other problems.  HPI  Past Medical History:  Diagnosis Date  . ADHD   . Anxiety   . Bipolar 1 disorder, depressed (HCC)   . Pinched nerve in neck    C 6-7    There are no active problems to display for this patient.   Past Surgical History:  Procedure Laterality Date  . ADENOIDECTOMY    . CESAREAN SECTION    . CYSTECTOMY     on overy  . TONSILLECTOMY    . TUBAL LIGATION    . WISDOM TOOTH EXTRACTION      OB History    No data available       Home Medications    Prior to Admission medications   Medication Sig Start Date End Date Taking? Authorizing Provider  FLUoxetine (PROZAC) 20 MG tablet Take 20 mg by mouth daily.   Yes [provider]  ibuprofen (ADVIL,MOTRIN) 200 MG tablet Take 400 mg by mouth every 4 (four) hours as needed.   Yes [provider]  lamoTRIgine (LAMICTAL) 25 MG tablet Take 50 mg by mouth daily.   Yes [provider]  MYDAYIS 25 MG CP24 Take 1 capsule by mouth every morning. 08/17/17  Yes [provider]  cyclobenzaprine (FLEXERIL) 10 MG tablet Take 1 tablet (10 mg total) by mouth 2 (two) times daily as needed for muscle spasms. 09/10/17   Janne Napoleon, NP  predniSONE (DELTASONE) 50 MG tablet Starting tomorrow 09/11/17 take one tablet PO daily 09/10/17   Janne Napoleon, NP    Family History No family history on file.  Social History Social History    Substance Use Topics  . Smoking status: Current Every Day Smoker    Packs/day: 0.50    Types: Cigarettes  . Smokeless tobacco: Never Used  . Alcohol use Yes     Comment: occ     Allergies   Ceclor [cefaclor]   Review of Systems Review of Systems  Constitutional: Negative for chills and fever.  HENT: Negative.   Respiratory: Negative for shortness of breath.   Cardiovascular: Negative for chest pain.  Gastrointestinal: Negative for vomiting.  Musculoskeletal: Positive for arthralgias and neck pain.  Skin: Negative for wound.  Neurological: Negative for syncope and headaches.  Psychiatric/Behavioral: Negative for confusion.     Physical Exam Updated Vital Signs BP 97/65   Pulse (!) 59   Temp 98.1 F (36.7 C) (Oral)   Resp 18   LMP 08/15/2017   SpO2 99%   Physical Exam  Constitutional: She appears well-developed and well-nourished.  Eyes: EOM are normal.  Neck: Trachea normal. Neck supple. Muscular tenderness present. No spinous process tenderness present. Decreased range of motion: due to pain and muscle spasm right side.  Cardiovascular: Bradycardia present.   Pulmonary/Chest: Effort normal.  Abdominal: Soft. There is no tenderness.  Musculoskeletal:  Right shoulder: She exhibits tenderness and spasm. She exhibits no crepitus, normal pulse and normal strength.       Cervical back: She exhibits spasm. She exhibits normal pulse.  Tender right side of neck with range of motion.  Neurological: She is alert.  Equal grips  Skin: Skin is warm and dry.  Psychiatric: She has a normal mood and affect. Her behavior is normal.  Nursing note and vitals reviewed.    ED Treatments / Results  Labs (all labs ordered are listed, but only abnormal results are displayed) Labs Reviewed - No data to display  Radiology Dg Clavicle Right  Result Date: 09/10/2017 CLINICAL DATA:  31 year old female with a history of pain EXAM: RIGHT CLAVICLE - 2+ VIEWS COMPARISON:  None.  FINDINGS: No acute displaced fracture. Right glenohumeral joint appears congruent. Minimal degenerative changes at the acromioclavicular joint. IMPRESSION: Negative for acute bony abnormality Electronically Signed   By: Gilmer MorJaime  Wagner D.O.   On: 09/10/2017 12:03    Procedures Procedures (including critical care time)  Medications Ordered in ED Medications  predniSONE (DELTASONE) tablet 60 mg (60 mg Oral Given 09/10/17 1209)     Initial Impression / Assessment and Plan / ED Course  I have reviewed the triage vital signs and the nursing notes. 31 y.o. female with right side neck pain that radiates to the right shoulder and clavicle x 5 day that increases with movement stable for d/c without cardiac symptoms. Will treat for muscle spasm with Flexeril and inflammation with prednisone. Return precautions discussed with the patient.   Final Clinical Impressions(s) / ED Diagnoses   Final diagnoses:  Right torticollis  Muscle spasms of neck    New Prescriptions Discharge Medication List as of 09/10/2017 12:27 PM    START taking these medications   Details  cyclobenzaprine (FLEXERIL) 10 MG tablet Take 1 tablet (10 mg total) by mouth 2 (two) times daily as needed for muscle spasms., Starting Sat 09/10/2017, Print    predniSONE (DELTASONE) 50 MG tablet Starting tomorrow 09/11/17 take one tablet PO daily, Print         Kerrie Buffaloeese, Hope [place], [place] 09/10/17 1550    Donnetta Hutchingook, Brian, MD 09/11/17 1018

## 2017-09-10 NOTE — Discharge Instructions (Signed)
Use warm moist heat to the area, continue to take ibuprofen. Follow up with your doctor or return here for worsening symptoms.

## 2017-11-23 ENCOUNTER — Emergency Department (HOSPITAL_COMMUNITY)
Admission: EM | Admit: 2017-11-23 | Discharge: 2017-11-23 | Disposition: A | Discharge status: Home / Self Care | Payer: Medicaid Other | Attending: Emergency Medicine | Admitting: Emergency Medicine

## 2017-11-23 ENCOUNTER — Encounter (HOSPITAL_COMMUNITY): Payer: Self-pay | Admitting: Emergency Medicine

## 2017-11-23 ENCOUNTER — Emergency Department (HOSPITAL_COMMUNITY): Payer: Medicaid Other

## 2017-11-23 DIAGNOSIS — G8929 Other chronic pain: Secondary | ICD-10-CM | POA: Diagnosis not present

## 2017-11-23 DIAGNOSIS — F17210 Nicotine dependence, cigarettes, uncomplicated: Secondary | ICD-10-CM | POA: Insufficient documentation

## 2017-11-23 DIAGNOSIS — M25561 Pain in right knee: Secondary | ICD-10-CM | POA: Diagnosis present

## 2017-11-23 DIAGNOSIS — Z79899 Other long term (current) drug therapy: Secondary | ICD-10-CM | POA: Insufficient documentation

## 2017-11-23 DIAGNOSIS — M25562 Pain in left knee: Secondary | ICD-10-CM

## 2017-11-23 MED ORDER — KETOROLAC TROMETHAMINE 30 MG/ML IJ SOLN
30.0000 mg | Freq: Once | INTRAMUSCULAR | Status: AC
  Administered 2017-11-23: 30 mg via INTRAMUSCULAR
  Filled 2017-11-23: qty 1

## 2017-11-23 MED ORDER — MELOXICAM 7.5 MG PO TABS: 7.5000 mg | ORAL_TABLET | Freq: Every day | ORAL | 0 refills | Status: DC

## 2017-11-23 NOTE — Discharge Instructions (Signed)
This is the safe way to take ibuprofen and tylenol.  DO NOT TAKE IBUPROFEN WHILE TAKING MOBIC.  You may take tylenol with mobic.   Please take Ibuprofen (Advil, motrin) and Tylenol (acetaminophen) to relieve your pain.  You may take up to 600 MG (3 pills) of normal strength ibuprofen every 8 hours as needed.  In between doses of ibuprofen you make take tylenol, up to 1,000 mg (two extra strength pills).  Do not take more than 3,000 mg tylenol in a 24 hour period.  Please check all medication labels as many medications such as pain and cold medications may contain tylenol.  Do not drink alcohol while taking these medications.  Do not take other NSAID'S while taking ibuprofen (such as aleve, motrin or naproxen).  Please take ibuprofen with food to decrease stomach upset.  Please monitor your bowel movements to make sure you do not have dark, tarry, sticky bowel movements.  Mobic makes you more at risk of having bleeding ulcers in your stomach, just like any other NSAID.  If you have dark, tarry, or sticky stools then stopped taking Mobic and other NSAIDs and follow-up with your doctor.  Please call the number listed at the end of this paperwork to obtain a primary care doctor.

## 2017-11-23 NOTE — ED Triage Notes (Signed)
Patient c/o left knee pain that has been ongoing for over month now. Pain is getting worse at night keeping patient from sleeping. Patient denies any falls or injuries to cause the pain.

## 2017-11-23 NOTE — ED Provider Notes (Signed)
[facility]-EMERGENCY DEPT Provider Note   CSN: [id number] Arrival date & time: 11/23/17  1200     History   Chief Complaint Chief Complaint  Patient presents with  . Knee Pain    HPI Sandra Garza is a 32 y.o. female who presents today for evaluation of left knee pain.  She reports that she used to get steroid shots in her left knee for arthritis, however has not had any that her pain has been gradually worsening over the past 4-6 weeks and is now causing her to have difficulty sleeping from pain.  She reports that today she has had 1 g of Tylenol without significant relief.  She reports that she took that around 8 in the morning.  Her pain is made worse with movement, and cold.  He does not radiate she denies any numbness or tingling, no fevers or chills.  She denies any calf pain or leg swelling.  She did not have an injury at the start of this.  Her pain is mildly relieved with hot baths.  HPI  Past Medical History:  Diagnosis Date  . ADHD   . Anxiety   . Bipolar 1 disorder, depressed (HCC)   . Pinched nerve in neck    C 6-7    There are no active problems to display for this patient.   Past Surgical History:  Procedure Laterality Date  . ADENOIDECTOMY    . CESAREAN SECTION    . CYSTECTOMY     on overy  . TONSILLECTOMY    . TUBAL LIGATION    . WISDOM TOOTH EXTRACTION      OB History    No data available       Home Medications    Prior to Admission medications   Medication Sig Start Date End Date Taking? Authorizing Provider  cyclobenzaprine (FLEXERIL) 10 MG tablet Take 1 tablet (10 mg total) by mouth 2 (two) times daily as needed for muscle spasms. 09/10/17   Janne NapoleonNeese, Hope M, NP  FLUoxetine (PROZAC) 20 MG tablet Take 20 mg by mouth daily.    [provider]  ibuprofen (ADVIL,MOTRIN) 200 MG tablet Take 400 mg by mouth every 4 (four) hours as needed.    [provider]  lamoTRIgine (LAMICTAL) 25 MG tablet Take 50 mg by  mouth daily.    [provider]  meloxicam (MOBIC) 7.5 MG tablet Take 1 tablet (7.5 mg total) by mouth daily. 11/23/17   Cristina GongHammond, Ebonie Westerlund W, PA-C  MYDAYIS 25 MG CP24 Take 1 capsule by mouth every morning. 08/17/17   [provider]  predniSONE (DELTASONE) 50 MG tablet Starting tomorrow 09/11/17 take one tablet PO daily 09/10/17   Janne NapoleonNeese, Hope M, NP    Family History No family history on file.  Social History Social History   Tobacco Use  . Smoking status: Current Every Day Smoker    Packs/day: 0.50    Types: Cigarettes  . Smokeless tobacco: Never Used  Substance Use Topics  . Alcohol use: Yes    Comment: occ  . Drug use: Yes    Types: Marijuana     Allergies   Ceclor [cefaclor]   Review of Systems Review of Systems  Constitutional: Negative for chills and fever.  Musculoskeletal: Positive for arthralgias. Negative for back pain, joint swelling and neck pain.  Skin: Negative for color change and wound.     Physical Exam Updated Vital Signs BP (!) 107/8 (BP Location: Left Arm)   Pulse 65  Temp 98.5 F (36.9 C) (Oral)   Resp 19   LMP 11/06/2017   SpO2 97%   Physical Exam  Constitutional: She appears well-developed and well-nourished.  Cardiovascular: Intact distal pulses.  Left leg 2+ DP/PT pulses.  Left leg and foot are warm and well perfused.  Musculoskeletal:  Left knee: there is tenderness to palpation over the lateral joint line.  There is no obvious swelling, erythema, edema, or ecchymosis.  There is no tenderness over proximal calf, legs appear equal in size.  There is increased pain with valgus/varus stress, despite knee being grossly stable.  Knee stable with anterior/posterior drawer test, anterior drawer test produces increased pain.    Neurological: She is alert. No sensory deficit.  Sensation intact to left leg.  Skin: Skin is warm and dry.  There is no redness, ecchymosis, or wounds over the left knee.  Psychiatric: She has a normal  mood and affect. Her behavior is normal.  Nursing note and vitals reviewed.    ED Treatments / Results  Labs (all labs ordered are listed, but only abnormal results are displayed) Labs Reviewed - No data to display  EKG  EKG Interpretation None       Radiology Dg Knee Complete 4 Views Left  Result Date: 11/23/2017 CLINICAL DATA:  Left knee pain started month ago EXAM: LEFT KNEE - COMPLETE 4+ VIEW COMPARISON:  None. FINDINGS: No acute fracture or dislocation. Mild lateral femorotibial compartment osteoarthritis. No significant joint effusion. No aggressive osseous lesion. Normal soft tissues. IMPRESSION: No acute osseous injury of the left knee. Electronically Signed   By: Elige Ko   On: 11/23/2017 14:09    Procedures Procedures (including critical care time)  Medications Ordered in ED Medications  ketorolac (TORADOL) 30 MG/ML injection 30 mg (not administered)     Initial Impression / Assessment and Plan / ED Course  I have reviewed the triage vital signs and the nursing notes.  Pertinent labs & imaging results that were available during my care of the patient were reviewed by me and considered in my medical decision making (see chart for details).    Patient presents today for evaluation of chronic left knee pain.  There is no specific injury at the start of her pain.  X-rays were obtained showing mild arthritis.  Patient's knee is grossly stable to my examination.  She is without systemic symptoms, no erythema, redness, or obvious swelling of the joint.  I am not concerned for gout or septic arthritis.  DVT was considered, however she does not have any calf tenderness to palpation, leg is not swollen, Wells criteria for DVT negative.  Given 4-6-week process I suspect that this most likely represents an arthritis flare.  Per her request she was given meloxicam, along with a Toradol injection while here in the emergency room.  She was instructed on safely using Tylenol as an  adjunct for her pain.  She was also given instructions for her future reference regarding safe dosing of ibuprofen, instructed not to use ibuprofen or other NSAIDs while taking Mobic and stated her understanding.  Patient given outpatient Orth O follow-up, declined knee immobilizer or crutches.  Return precautions discussed.   Final Clinical Impressions(s) / ED Diagnoses   Final diagnoses:  Chronic pain of left knee    ED Discharge Orders        Ordered    meloxicam (MOBIC) 7.5 MG tablet  Daily     11/23/17 2037       Jeraldine Loots,  Earnstine Regal, PA-C 11/23/17 2048    Little, Ambrose Finland, MD 11/24/17 [phone]

## 2017-12-20 ENCOUNTER — Emergency Department (HOSPITAL_COMMUNITY)
Admission: EM | Admit: 2017-12-20 | Discharge: 2017-12-20 | Discharge status: Absent without leave | Payer: Medicaid Other

## 2017-12-20 NOTE — ED Notes (Signed)
Called for triage room x2

## 2017-12-26 ENCOUNTER — Other Ambulatory Visit: Payer: Self-pay

## 2017-12-26 ENCOUNTER — Encounter: Payer: Self-pay | Admitting: Family Medicine

## 2017-12-26 ENCOUNTER — Ambulatory Visit: Payer: Medicaid Other | Admitting: Family Medicine

## 2017-12-26 VITALS — BP 118/70 | HR 72 | Temp 98.6°F | Ht 64.0 in | Wt 223.0 lb

## 2017-12-26 DIAGNOSIS — R202 Paresthesia of skin: Secondary | ICD-10-CM | POA: Diagnosis not present

## 2017-12-26 DIAGNOSIS — Z720 Tobacco use: Secondary | ICD-10-CM

## 2017-12-26 DIAGNOSIS — J4520 Mild intermittent asthma, uncomplicated: Secondary | ICD-10-CM

## 2017-12-26 DIAGNOSIS — F319 Bipolar disorder, unspecified: Secondary | ICD-10-CM

## 2017-12-26 DIAGNOSIS — R200 Anesthesia of skin: Secondary | ICD-10-CM | POA: Diagnosis present

## 2017-12-26 MED ORDER — GABAPENTIN 300 MG PO CAPS: 300.0000 mg | ORAL_CAPSULE | Freq: Every day | ORAL | 3 refills | Status: DC

## 2017-12-26 MED ORDER — NICOTINE 14 MG/24HR TD PT24: 14.0000 mg | MEDICATED_PATCH | Freq: Every day | TRANSDERMAL | 2 refills | Status: DC

## 2017-12-26 NOTE — Progress Notes (Signed)
Subjective:    Patient ID: Sandra Garza, female    DOB: November 17, 1985, 32 y.o.   MRN: [id number]  CC: establishing care  HPI:  Bipolar Type 1: - Patient followed by Mental Health team for management of this  - Patient reportedly just had manic state and was able tog et out of it ok - Patient prescribed prozac, which is an SSRI, do not know if she should be taking this if she truly has Bipolar, will obtain previous records from [place]  Asthma: - Patient has reportedly had asthma as a child - No recent trouble, has rescue inhaler to use as needed - No recent illnesses - Is a current smoker, but is interested in stopping and has had success with patch in past  Pinch Nerve C6-7: causing R UE pain - Patient with history of pinched nerve in reportedly C6-7 that affects her R UE - Says in [place] she had MRI's and EMG testing - Patient has had long time battle with this, and reports that she was supposed to be seen by neuro surgery in [place] but never went, she was managed by a pain management doctor there  R Hand numbness/tingling: - She had previously taken gabapentin with success - She reports that over the weekend her thumb, and first through fourth digits turned purple and were numb and tingling- this has improved - Today her third and fourth digit are tingling and painful - She now uses ibuprofen to help with the pain - At night she as issues sleeping- can not lay on R side, and is often woken up by the pain  Desire to quit Smoking: - With history of asthma, will send patient with patches as these have worked for her in the past  Review of Systems  Constitutional: Negative for chills and fever.  HENT: Negative for congestion.   Eyes: Negative for blurred vision and double vision.  Respiratory: Positive for cough. Negative for shortness of breath and wheezing.   Cardiovascular: Negative for chest pain.  Gastrointestinal: Negative for abdominal pain and vomiting.    Genitourinary: Negative for dysuria.  Musculoskeletal: Positive for myalgias and neck pain.  Skin: Negative for rash.  Neurological: Positive for tingling. Negative for focal weakness.  Psychiatric/Behavioral: Negative for suicidal ideas. The patient is nervous/anxious.    Patient Active Problem List   Diagnosis Date Noted  . Numbness and tingling of right hand 12/28/2017  . Mild intermittent asthma without complication 12/28/2017  . Bipolar affective disorder (HCC) 12/28/2017  . Smoking trying to quit 12/28/2017     Family History  Problem Relation Age of Onset  . Heart disease Maternal Grandmother     Past Medical History:  Diagnosis Date  . ADHD   . Anxiety   . Asthma   . Bipolar 1 disorder, depressed (HCC)   . Hypercholesteremia   . Pinched nerve in neck    C 6-7    Social Hx: Denies tobacco use, alcohol use, or illicit drug use. 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 Recently separated from long-time boyfriend.  Objective:  BP 118/70   Pulse 72   Temp 98.6 F (37 C) (Oral)   Ht 5\' 4"  (1.626 m)   Wt 223 lb (101.2 kg)   LMP 12/02/2017 (Approximate)   SpO2 97%   BMI 38.28 kg/m  Vitals and nursing note reviewed  General: NAD, pleasant Head: Atraumatic Neck: Supple Cardiac: RRR, normal heart sounds, no murmurs Respiratory: CTAB, normal effort Abdomen:  soft, nontender, nondistended. Bowel sounds present Extremities: no edema or cyanosis. WWP. Cap refill <3 sec. Radial pulses 2+ BL. + Tinel's for pins and needles. FROM in R hand. RUE with 5/5 strength, LUE with 5/5 strength MSK: normal gait Skin: warm and dry, no rashes noted Neuro: alert and oriented, no focal deficits Psych: Neatly groomed and appropriately dressed. Maintains good eye contact and is cooperative and attentive. Speech is normal volume and rate. Denies SI/ HI. Normal affect.  Depression screen PHQ 2/9 12/26/2017  Decreased Interest 3  Down, Depressed, Hopeless 3  PHQ - 2 Score  6   Assessment & Plan:    Bipolar affective disorder (HCC) Patient on prozac and managed by psych team. Will obtain previous records from [place] and talk with current team to determine if this is truly Bipolar bc patient reports recent manic state and may not adjustment of medication off of prozac as SSRI's cause induction of manic state in Bipolar.   Numbness and tingling of right hand Precepted with Dr. Sheffield Slider and he evaluated patient as well. Given that patient is improving and this a chronic problem will try and start he ron gabapentin for her discomfort to help her sleep at night. Patient currently taking trazadone for sleep but reports that it does not help. Will stop the trazadone.  Obtaining records of previous MRI's and EMG studies. Will also refer to pain management clinic per patient's request, however, she is currently only using ibuprofen and is not limited in her daily activities on interview.   Mild intermittent asthma without complication Patient with no recent trouble breathing. Uses rescue inhaler as needed, does not need refill.  Will give nicotine patch to help quit somking  Smoking trying to quit Counseled and nicotine patches sent to pharmacy. Given quit line information and card.   [place] Armen Waring, DO Family Medicine Resident, PGY-1

## 2017-12-26 NOTE — Patient Instructions (Addendum)
Thank you for coming to see me today. It was a pleasure meeting you! Today we talked about:   - Your hand pain. I recommend stopping the trazodone for sleep since you report that it isn't working well for you and starting gabapentin 300 mg nightly.  - I will put in a referral for you for pain management.   - I have sent nicotine patches to your pharmacy.  Please follow-up with me in 1 year or as needed.  If you have any questions or concerns, please do not hesitate to call the office at [phone].  Take Care,   [place] Isid[place] Monks, DO

## 2017-12-28 DIAGNOSIS — J4520 Mild intermittent asthma, uncomplicated: Secondary | ICD-10-CM | POA: Insufficient documentation

## 2017-12-28 DIAGNOSIS — Z720 Tobacco use: Secondary | ICD-10-CM | POA: Insufficient documentation

## 2017-12-28 DIAGNOSIS — R200 Anesthesia of skin: Secondary | ICD-10-CM | POA: Insufficient documentation

## 2017-12-28 DIAGNOSIS — R202 Paresthesia of skin: Principal | ICD-10-CM

## 2017-12-28 DIAGNOSIS — F319 Bipolar disorder, unspecified: Secondary | ICD-10-CM | POA: Insufficient documentation

## 2017-12-28 NOTE — Assessment & Plan Note (Signed)
Patient with no recent trouble breathing. Uses rescue inhaler as needed, does not need refill.  Will give nicotine patch to help quit somking

## 2017-12-28 NOTE — Assessment & Plan Note (Signed)
Patient on prozac and managed by psych team. Will obtain previous records from [place] and talk with current team to determine if this is truly Bipolar bc patient reports recent manic state and may not adjustment of medication off of prozac as SSRI's cause induction of manic state in Bipolar.

## 2017-12-28 NOTE — Assessment & Plan Note (Signed)
Counseled and nicotine patches sent to pharmacy. Given quit line information and card.

## 2017-12-28 NOTE — Assessment & Plan Note (Signed)
Precepted with Dr. Sheffield SliderHale and he evaluated patient as well. Given that patient is improving and this a chronic problem will try and start he ron gabapentin for her discomfort to help her sleep at night. Patient currently taking trazadone for sleep but reports that it does not help. Will stop the trazadone.  Obtaining records of previous MRI's and EMG studies. Will also refer to pain management clinic per patient's request, however, she is currently only using ibuprofen and is not limited in her daily activities on interview.

## 2017-12-30 ENCOUNTER — Encounter: Payer: Self-pay | Admitting: Family Medicine

## 2018-01-03 ENCOUNTER — Encounter: Payer: Self-pay | Admitting: Family Medicine

## 2018-01-27 ENCOUNTER — Encounter: Payer: Self-pay | Admitting: Family Medicine

## 2018-02-04 ENCOUNTER — Encounter: Payer: Self-pay | Admitting: Family Medicine

## 2018-02-04 DIAGNOSIS — R200 Anesthesia of skin: Secondary | ICD-10-CM

## 2018-02-04 DIAGNOSIS — R202 Paresthesia of skin: Principal | ICD-10-CM

## 2018-02-04 DIAGNOSIS — M542 Cervicalgia: Secondary | ICD-10-CM

## 2018-03-14 ENCOUNTER — Ambulatory Visit (INDEPENDENT_AMBULATORY_CARE_PROVIDER_SITE_OTHER): Payer: Medicaid Other | Admitting: Family Medicine

## 2018-03-14 DIAGNOSIS — B070 Plantar wart: Secondary | ICD-10-CM

## 2018-03-14 NOTE — Patient Instructions (Signed)
It was nice meeting you today.  You were seen in clinic for a plantar wart on your left foot.  We froze it off using  cryotherapy.  As we discussed, the area may blister within a day or two.     Be well, Sandra March MD   Plantar Warts Plantar warts are small growths on the bottom of the foot (sole). Warts are caused by a type of germ (virus). Most warts are not painful, and they usually do not cause problems. Sometimes, plantar warts can cause pain when you walk. Warts often go away on their own in time. Treatments may be done if needed. Follow these instructions at home: General instructions  Apply creams or solutions only as told by your doctor. Follow these steps if your doctor tells you to do so: ? Soak your foot in warm water. ? Remove the top layer of softened skin before you apply the medicine. You can use a pumice stone to remove the tissue. ? After you apply the medicine, put a bandage over the area of the wart. ? Repeat the process every day or as told by your doctor.  Do not scratch or pick at a wart.  Wash your hands after you touch a wart.  If a wart is painful, try putting a bandage with a hole in the middle over the wart.  Keep all follow-up visits as told by your doctor. This is important. Prevention  Wear shoes and socks. Change socks every day.  Keep your feet clean and dry.  Check your feet often.  Avoid direct contact with warts on other people. Contact a doctor if:  Your warts do not improve after treatment.  You have redness, swelling, or pain at the site of a wart.  You have bleeding from a wart, and the bleeding does not stop when you put light pressure on the wart.  You have diabetes and you get a wart. This information is not intended to replace advice given to you by your health care provider. Make sure you discuss any questions you have with your health care provider. Document Released: 11/27/2010 Document Revised: 04/01/2016 Document Reviewed:  01/20/2015 Elsevier Interactive Patient Education  [employer].  for you.

## 2018-03-14 NOTE — Progress Notes (Signed)
   Subjective:   Patient ID: Sandra Garza    DOB: 1986-01-26, 32 y.o. female   MRN: [id number]  CC: plantar wart   HPI: Sandra Garza is a 32 y.o. female who presents to clinic today for plantar wart.  Plantar wart Patient reports she has had this for about a year.  She has not had this particular one frozen off previously.  She has a history of warts on hands and feet before which she has done cryotherapy for.  She mostly wears sneakers and avoids wearing tight ill-fitting shoes.  She has a pressure point on her outer L foot which is mildly tender but otherwise does not complain of pain.  No fever, chills, nausea, vomiting.  No numbness, tingling.     ROS: See HPI for pertinent ROS.  Social: pt is a current everyday smoker  Medications reviewed. Objective:  Vitals and nursing note reviewed, no abnormalities noted.   General: 32 yo female, NAD  CV: RRR no MRG  Lungs: CTAB, normal effort  Abdomen: soft, NTND,  Skin: warm, dry  L foot - single plantar wart present between first and second toe pads, no surrounding erythema or tenderness   Extremities: warm and well perfused, normal tone   Assessment & Plan:   Plantar wart of left foot Single plantar wart of  L foot between first and second toe pads. No signs of surrounding infection or involvement.  Pt requesting freezing it off during visit today.   -Consent obtained and cryotherapy used  -follow up care instructions provided   Freddrick March, MD [facility] Family Medicine, PGY-2 03/20/2018 3:42 PM

## 2018-03-20 ENCOUNTER — Encounter: Payer: Self-pay | Admitting: Family Medicine

## 2018-03-20 DIAGNOSIS — B070 Plantar wart: Secondary | ICD-10-CM | POA: Insufficient documentation

## 2018-03-20 NOTE — Assessment & Plan Note (Signed)
Single plantar wart of  L foot between first and second toe pads. No signs of surrounding infection or involvement.  Pt requesting freezing it off during visit today.   -Consent obtained and cryotherapy used  -follow up care instructions provided

## 2018-04-17 ENCOUNTER — Other Ambulatory Visit: Payer: Self-pay | Admitting: Family Medicine

## 2018-05-31 ENCOUNTER — Encounter: Payer: Self-pay | Admitting: Physical Medicine & Rehabilitation

## 2018-06-15 ENCOUNTER — Encounter: Payer: Self-pay | Admitting: Physical Medicine & Rehabilitation

## 2018-06-15 ENCOUNTER — Encounter: Payer: Medicaid Other | Attending: Physical Medicine & Rehabilitation | Admitting: Physical Medicine & Rehabilitation

## 2018-06-15 VITALS — BP 127/81 | HR 76 | Ht 64.0 in | Wt 201.0 lb

## 2018-06-15 DIAGNOSIS — G894 Chronic pain syndrome: Secondary | ICD-10-CM

## 2018-06-15 DIAGNOSIS — Z6834 Body mass index (BMI) 34.0-34.9, adult: Secondary | ICD-10-CM | POA: Insufficient documentation

## 2018-06-15 DIAGNOSIS — R202 Paresthesia of skin: Secondary | ICD-10-CM

## 2018-06-15 DIAGNOSIS — F909 Attention-deficit hyperactivity disorder, unspecified type: Secondary | ICD-10-CM | POA: Insufficient documentation

## 2018-06-15 DIAGNOSIS — F17210 Nicotine dependence, cigarettes, uncomplicated: Secondary | ICD-10-CM | POA: Diagnosis not present

## 2018-06-15 DIAGNOSIS — R200 Anesthesia of skin: Secondary | ICD-10-CM

## 2018-06-15 DIAGNOSIS — Z79899 Other long term (current) drug therapy: Secondary | ICD-10-CM | POA: Diagnosis not present

## 2018-06-15 DIAGNOSIS — G479 Sleep disorder, unspecified: Secondary | ICD-10-CM

## 2018-06-15 DIAGNOSIS — M5412 Radiculopathy, cervical region: Secondary | ICD-10-CM | POA: Diagnosis not present

## 2018-06-15 DIAGNOSIS — E7800 Pure hypercholesterolemia, unspecified: Secondary | ICD-10-CM | POA: Insufficient documentation

## 2018-06-15 DIAGNOSIS — M791 Myalgia, unspecified site: Secondary | ICD-10-CM | POA: Diagnosis not present

## 2018-06-15 DIAGNOSIS — F419 Anxiety disorder, unspecified: Secondary | ICD-10-CM | POA: Insufficient documentation

## 2018-06-15 DIAGNOSIS — F319 Bipolar disorder, unspecified: Secondary | ICD-10-CM | POA: Diagnosis not present

## 2018-06-15 DIAGNOSIS — J45909 Unspecified asthma, uncomplicated: Secondary | ICD-10-CM | POA: Insufficient documentation

## 2018-06-15 DIAGNOSIS — G8929 Other chronic pain: Secondary | ICD-10-CM | POA: Insufficient documentation

## 2018-06-15 MED ORDER — GABAPENTIN 300 MG PO CAPS
300.0000 mg | ORAL_CAPSULE | Freq: Three times a day (TID) | ORAL | 1 refills | Status: DC
Start: 2018-06-15 — End: 2018-08-10

## 2018-06-15 MED ORDER — BACLOFEN 10 MG PO TABS: 10.0000 mg | ORAL_TABLET | Freq: Three times a day (TID) | ORAL | 1 refills | Status: DC

## 2018-06-15 MED ORDER — AMITRIPTYLINE HCL 10 MG PO TABS: 10.0000 mg | ORAL_TABLET | Freq: Every day | ORAL | 1 refills | Status: DC

## 2018-06-15 NOTE — Progress Notes (Signed)
Subjective:    Patient ID: Sandra Garza, female    DOB: [birth year], 32 y.o.   MRN: [id number]  HPI 32 y/o with pmh of Bipolar disorder, asthma, anxiety/depression, ADHD present with neck pain. Started in 2005 after pregnancy.  Getting progressively worse. Mainly on right side. ?ESIs and hydrocodone improve the pain.  Cold, heavy lifting, writing exacerbate the pain.  Dull, sharp, ache.  Radiates to 1-3 digits RUE.  Constant.  She had PT in 2015 with benefit.  Associated, tingling, weakness.  Denies falls. Pain limits lifting/writing.    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  Pain Inventory Average Pain 7 Pain Right Now 7 My pain is constant, tingling, aching and numbness  In the last 24 hours, has pain interfered with the following? General activity 4 Relation with others 2 Enjoyment of life 0 What TIME of day is your pain at its worst? morning Sleep (in general) Poor  Pain is worse with: some activites Pain improves with: rest and heat/ice Relief from Meds: 0  Mobility walk without assistance ability to climb steps?  yes do you drive?  yes  Function disabled: date disabled 2011  Neuro/Psych numbness tremor tingling spasms dizziness depression  Prior Studies NA  Physicians involved in your care NA   Family History  Problem Relation Age of Onset  . Heart disease Maternal Grandmother    Social History   Socioeconomic History  . Marital status: Single    Spouse name: Not on file  . Number of children: Not on file  . Years of education: Not on file  . Highest education level: Not on file  Occupational History  . Not on file  Social Needs  . Financial resource strain: Not on file  . Food insecurity:    Worry: Not on file    Inability: Not on file  . Transportation needs:    Medical: Not on file    Non-medical: Not on file  Tobacco Use  . Smoking status: Current Every Day Smoker    Packs/day: 0.75    Types: Cigarettes      Start date: 12/26/2006  . Smokeless tobacco: Never Used  Substance and Sexual Activity  . Alcohol use: Not Currently  . Drug use: Yes    Types: Marijuana  . Sexual activity: Yes    Birth control/protection: Surgical  Lifestyle  . Physical activity:    Days per week: Not on file    Minutes per session: Not on file  . Stress: Not on file  Relationships  . Social connections:    Talks on phone: Not on file    Gets together: Not on file    Attends religious service: Not on file    Active member of club or organization: Not on file    Attends meetings of clubs or organizations: Not on file    Relationship status: Not on file  Other Topics Concern  . Not on file  Social History Narrative  . Not on file   Past Surgical History:  Procedure Laterality Date  . ADENOIDECTOMY    . CESAREAN SECTION    . CYSTECTOMY     on overy  . TONSILLECTOMY    . TUBAL LIGATION    . WISDOM TOOTH EXTRACTION     Past Medical History:  Diagnosis Date  . ADHD   . Anxiety   . Asthma   . Bipolar 1 disorder, depressed (HCC)   . Hypercholesteremia   .  Pinched nerve in neck    C 6-7   BP 127/81   Pulse 76   Ht 5\' 4"  (1.626 m)   Wt 201 lb (91.2 kg)   LMP 06/05/2018   SpO2 95%   BMI 34.50 kg/m   Opioid Risk Score:   Fall Risk Score:  `1  Depression screen PHQ 2/9  [facility] 06/15/2018 12/26/2017  Decreased Interest 1 3  Down, Depressed, Hopeless 1 3  PHQ - 2 Score 2 6  Altered sleeping 2 -  Tired, decreased energy 2 -  Change in appetite 2 -  Feeling bad or failure about yourself  0 -  Trouble concentrating 2 -  Moving slowly or fidgety/restless 1 -  Suicidal thoughts 0 -  PHQ-9 Score 11 -  Difficult doing work/chores Very difficult -     Review of Systems  Constitutional: Negative.   HENT: Negative.   Eyes: Negative.   Respiratory: Negative.   Cardiovascular: Negative.   Gastrointestinal: Negative.   Endocrine: Negative.   Genitourinary: Negative.    Musculoskeletal: Positive for arthralgias, myalgias, neck pain and neck stiffness.  Skin: Negative.   Allergic/Immunologic: Negative.   Neurological: Positive for numbness.  Hematological: Negative.   Psychiatric/Behavioral: Positive for dysphoric mood.  All other systems reviewed and are negative.      Objective:   Physical Exam Gen: NAD. Vital signs reviewed HENT: Normocephalic, Atraumatic Eyes: EOMI. No discharge.  Cardio: RRR. No JVD. Pulm: B/l clear to auscultation.  Effort normal Abd: Soft, BS+ MSK:  Gait WNL, including heel/toe and tandem.   No TTP.    No edema.   Neg Hawkin's b/l.   Neg empty can test. Neuro: CN II-XII grossly intact.    Sensation diminished to light touch C6-T1 RUE  Reflexes 2+ throughout  Strength  4/5 in all RUE myotomes (some pain inhibition)    5/5 in all LUE myotomes  Neg Spurling b/l Skin: Warm and Dry. Intact    Assessment & Plan:  32 y/o with pmh of Bipolar disorder, asthma, anxiety/depression, ADHD present with neck pain.   1. Chronic neck pain due radiculopathy  MRI from 2017 reviewed, revealing Right C4 impingement  NCS/EMG 2014 suggesting chronic C6-T1 b/l radiculopathy  Limited referral information reviewed  PMAWARE reviewed  UDS performed  Cont Heat  Encouraged trial Lidoderm OTC patch  Will increase Gabapentin to 300 TID  Will change Fluoxetine to Cymbalta 30mg  daily with food after discussion with Psych  Will order Baclofen 10 TID  Will consider Mobic in future  Cont to follow up with therapies  Will consider PT in future with trial of TENS and traction, with consideration of massage therapy after children in school  Will consider NCS/EMG  Will not prescribe narcotic given pmh and hx of marijuana use, and opiod risk score  Will order Vit B12/D/Mag levels   2. Sleep disturbance  Will order elavil 10mg  qhs  3. Morbid Obesity  Not interested in seeing dietitian   Cont weight loss  4. Myalgia   Will consider trigger  point injections in future

## 2018-06-15 NOTE — Patient Instructions (Addendum)
Please trial over the counter Lidocaine patches  Please follow up with Psychiatry to transition from Fluoxetine Cymbalta

## 2018-06-16 LAB — MAGNESIUM: Magnesium: 2.1 mg/dL (ref 1.6–2.3)

## 2018-06-16 LAB — VITAMIN B12: VITAMIN B 12: 604 pg/mL (ref 232–1245)

## 2018-06-16 LAB — VITAMIN D 25 HYDROXY (VIT D DEFICIENCY, FRACTURES): VIT D 25 HYDROXY: 33.2 ng/mL (ref 30.0–100.0)

## 2018-07-06 ENCOUNTER — Encounter: Payer: Self-pay | Admitting: Family Medicine

## 2018-07-11 ENCOUNTER — Ambulatory Visit: Payer: Medicaid Other | Admitting: Family Medicine

## 2018-07-12 ENCOUNTER — Other Ambulatory Visit: Payer: Self-pay

## 2018-07-12 ENCOUNTER — Ambulatory Visit (HOSPITAL_COMMUNITY)
Admission: RE | Admit: 2018-07-12 | Discharge: 2018-07-12 | Disposition: A | Discharge status: Home / Self Care | Payer: Medicaid Other | Source: Ambulatory Visit | Attending: Family Medicine | Admitting: Family Medicine

## 2018-07-12 ENCOUNTER — Ambulatory Visit: Payer: Medicaid Other | Admitting: Family Medicine

## 2018-07-12 DIAGNOSIS — M25512 Pain in left shoulder: Secondary | ICD-10-CM
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
## 2018-07-13 ENCOUNTER — Encounter: Payer: Self-pay | Admitting: Physical Medicine & Rehabilitation

## 2018-07-13 ENCOUNTER — Other Ambulatory Visit: Payer: Self-pay

## 2018-07-13 ENCOUNTER — Encounter: Payer: Medicaid Other | Attending: Physical Medicine & Rehabilitation | Admitting: Physical Medicine & Rehabilitation

## 2018-07-13 VITALS — BP 121/83 | HR 78 | Ht 64.0 in | Wt 199.8 lb

## 2018-07-13 DIAGNOSIS — F419 Anxiety disorder, unspecified: Secondary | ICD-10-CM | POA: Diagnosis not present

## 2018-07-13 DIAGNOSIS — F319 Bipolar disorder, unspecified: Secondary | ICD-10-CM

## 2018-07-13 DIAGNOSIS — J45909 Unspecified asthma, uncomplicated: Secondary | ICD-10-CM | POA: Insufficient documentation

## 2018-07-13 DIAGNOSIS — G894 Chronic pain syndrome: Secondary | ICD-10-CM | POA: Diagnosis not present

## 2018-07-13 DIAGNOSIS — R200 Anesthesia of skin: Secondary | ICD-10-CM | POA: Diagnosis not present

## 2018-07-13 DIAGNOSIS — F17210 Nicotine dependence, cigarettes, uncomplicated: Secondary | ICD-10-CM | POA: Insufficient documentation

## 2018-07-13 DIAGNOSIS — F909 Attention-deficit hyperactivity disorder, unspecified type: Secondary | ICD-10-CM | POA: Diagnosis not present

## 2018-07-13 DIAGNOSIS — Z79899 Other long term (current) drug therapy: Secondary | ICD-10-CM | POA: Diagnosis not present

## 2018-07-13 DIAGNOSIS — G479 Sleep disorder, unspecified: Secondary | ICD-10-CM | POA: Diagnosis not present

## 2018-07-13 DIAGNOSIS — Z6834 Body mass index (BMI) 34.0-34.9, adult: Secondary | ICD-10-CM | POA: Insufficient documentation

## 2018-07-13 DIAGNOSIS — R202 Paresthesia of skin: Secondary | ICD-10-CM

## 2018-07-13 DIAGNOSIS — M5412 Radiculopathy, cervical region: Secondary | ICD-10-CM | POA: Diagnosis not present

## 2018-07-13 DIAGNOSIS — M791 Myalgia, unspecified site: Secondary | ICD-10-CM | POA: Diagnosis not present

## 2018-07-13 DIAGNOSIS — E7800 Pure hypercholesterolemia, unspecified: Secondary | ICD-10-CM | POA: Insufficient documentation

## 2018-07-13 DIAGNOSIS — G8929 Other chronic pain: Secondary | ICD-10-CM | POA: Diagnosis not present

## 2018-07-13 MED ORDER — AMITRIPTYLINE HCL 25 MG PO TABS: 25.0000 mg | ORAL_TABLET | Freq: Every day | ORAL | 1 refills | Status: DC

## 2018-07-13 MED ORDER — BACLOFEN 20 MG PO TABS: 20.0000 mg | ORAL_TABLET | Freq: Three times a day (TID) | ORAL | 1 refills | Status: DC
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
## 2018-07-21 DIAGNOSIS — M25512 Pain in left shoulder: Secondary | ICD-10-CM | POA: Insufficient documentation
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
## 2018-08-10 ENCOUNTER — Encounter: Payer: Medicaid Other | Attending: Physical Medicine & Rehabilitation | Admitting: Physical Medicine & Rehabilitation

## 2018-08-10 ENCOUNTER — Other Ambulatory Visit: Payer: Self-pay

## 2018-08-10 ENCOUNTER — Encounter: Payer: Self-pay | Admitting: Physical Medicine & Rehabilitation

## 2018-08-10 VITALS — BP 114/61 | HR 106 | Ht 64.0 in | Wt 197.4 lb

## 2018-08-10 DIAGNOSIS — M5412 Radiculopathy, cervical region: Secondary | ICD-10-CM | POA: Insufficient documentation

## 2018-08-10 DIAGNOSIS — J45909 Unspecified asthma, uncomplicated: Secondary | ICD-10-CM | POA: Diagnosis not present

## 2018-08-10 DIAGNOSIS — E7800 Pure hypercholesterolemia, unspecified: Secondary | ICD-10-CM | POA: Diagnosis not present

## 2018-08-10 DIAGNOSIS — G8929 Other chronic pain: Secondary | ICD-10-CM | POA: Insufficient documentation

## 2018-08-10 DIAGNOSIS — Z6834 Body mass index (BMI) 34.0-34.9, adult: Secondary | ICD-10-CM | POA: Insufficient documentation

## 2018-08-10 DIAGNOSIS — R451 Restlessness and agitation: Secondary | ICD-10-CM

## 2018-08-10 DIAGNOSIS — G479 Sleep disorder, unspecified: Secondary | ICD-10-CM | POA: Diagnosis not present

## 2018-08-10 DIAGNOSIS — F17210 Nicotine dependence, cigarettes, uncomplicated: Secondary | ICD-10-CM | POA: Insufficient documentation

## 2018-08-10 DIAGNOSIS — R200 Anesthesia of skin: Secondary | ICD-10-CM | POA: Diagnosis not present

## 2018-08-10 DIAGNOSIS — F319 Bipolar disorder, unspecified: Secondary | ICD-10-CM | POA: Insufficient documentation

## 2018-08-10 DIAGNOSIS — Z79899 Other long term (current) drug therapy: Secondary | ICD-10-CM | POA: Diagnosis not present

## 2018-08-10 DIAGNOSIS — G894 Chronic pain syndrome: Secondary | ICD-10-CM | POA: Diagnosis not present

## 2018-08-10 DIAGNOSIS — F419 Anxiety disorder, unspecified: Secondary | ICD-10-CM | POA: Diagnosis not present

## 2018-08-10 DIAGNOSIS — M791 Myalgia, unspecified site: Secondary | ICD-10-CM | POA: Diagnosis not present

## 2018-08-10 DIAGNOSIS — F909 Attention-deficit hyperactivity disorder, unspecified type: Secondary | ICD-10-CM | POA: Insufficient documentation

## 2018-08-10 DIAGNOSIS — R202 Paresthesia of skin: Secondary | ICD-10-CM

## 2018-08-10 MED ORDER — GABAPENTIN 300 MG PO CAPS: 300.0000 mg | ORAL_CAPSULE | Freq: Three times a day (TID) | ORAL | 1 refills | Status: DC

## 2018-08-10 NOTE — Addendum Note (Signed)
Addended by: Barbee Shropshire B on: 08/10/2018 11:33 AM   Modules accepted: Orders

## 2018-08-10 NOTE — Progress Notes (Signed)
Subjective:    Patient ID: Sandra Garza, female    DOB: February 26, 1986, 32 y.o.   MRN: [id number]  HPI 32 y/o with pmh of Bipolar disorder, asthma, anxiety/depression, ADHD present with neck pain.  Initially stated: Started in 2005 after pregnancy.  Getting progressively worse. Mainly on right side. ?ESIs and hydrocodone improve the pain.  Cold, heavy lifting, writing exacerbate the pain.  Dull, sharp, ache.  Radiates to 1-3 digits RUE.  Constant.  She had PT in 2015 with benefit.  Associated, tingling, weakness.  Denies falls. Pain limits lifting/writing.    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  Last clinic visit 07/13/18. Since that time, pt states she takes 1-2 Gabentin tablets per day. She tolerated increased in Baclofen with benefit.  She tolerated increase in Elavil with intermittent benefit. Left shoulder xray was normal per pt.  Pt is restless with involuntary movements. She notes that her pain is a little worse, but states she is doing a lot more now.   Pain Inventory Average Pain 6 Pain Right Now 9 My pain is sharp, burning, tingling and numbness  In the last 24 hours, has pain interfered with the following? General activity 0 Relation with others 0 Enjoyment of life 0 What TIME of day is your pain at its worst? evening and night Sleep (in general) Poor  Pain is worse with: some activites Pain improves with: rest and heat/ice Relief from Meds: 0  Mobility walk without assistance ability to climb steps?  yes do you drive?  yes  Function disabled: date disabled 2011  Neuro/Psych numbness tremor tingling spasms dizziness depression  Prior Studies NA  Physicians involved in your care NA   Family History  Problem Relation Age of Onset  . Heart disease Maternal Grandmother    Social History   Socioeconomic History  . Marital status: Single    Spouse name: Not on file  . Number of children: Not on file  . Years of education:  Not on file  . Highest education level: Not on file  Occupational History  . Not on file  Social Needs  . Financial resource strain: Not on file  . Food insecurity:    Worry: Not on file    Inability: Not on file  . Transportation needs:    Medical: Not on file    Non-medical: Not on file  Tobacco Use  . Smoking status: Current Every Day Smoker    Packs/day: 0.75    Types: Cigarettes    Start date: 12/26/2006  . Smokeless tobacco: Never Used  Substance and Sexual Activity  . Alcohol use: Not Currently  . Drug use: Yes    Types: Marijuana  . Sexual activity: Yes    Birth control/protection: Surgical  Lifestyle  . Physical activity:    Days per week: Not on file    Minutes per session: Not on file  . Stress: Not on file  Relationships  . Social connections:    Talks on phone: Not on file    Gets together: Not on file    Attends religious service: Not on file    Active member of club or organization: Not on file    Attends meetings of clubs or organizations: Not on file    Relationship status: Not on file  Other Topics Concern  . Not on file  Social History Narrative  . Not on file   Past Surgical History:  Procedure Laterality Date  .  ADENOIDECTOMY    . CESAREAN SECTION    . CYSTECTOMY     on overy  . TONSILLECTOMY    . TUBAL LIGATION    . WISDOM TOOTH EXTRACTION     Past Medical History:  Diagnosis Date  . ADHD   . Anxiety   . Asthma   . Bipolar 1 disorder, depressed (HCC)   . Hypercholesteremia   . Pinched nerve in neck    C 6-7   BP 114/61   Pulse (!) 106   Ht 5\' 4"  (1.626 m)   Wt 197 lb 6.4 oz (89.5 kg)   SpO2 95%   BMI 33.88 kg/m   Opioid Risk Score:   Fall Risk Score:  `1  Depression screen PHQ 2/9  Depression screen [facility] 2/9 08/10/2018 07/13/2018 07/12/2018 06/15/2018 12/26/2017  Decreased Interest 1 0 0 1 3  Down, Depressed, Hopeless 1 0 0 1 3  PHQ - 2 Score 2 0 0 2 6  Altered sleeping - - - 2 -  Tired, decreased energy - - - 2 -  Change in  appetite - - - 2 -  Feeling bad or failure about yourself  - - - 0 -  Trouble concentrating - - - 2 -  Moving slowly or fidgety/restless - - - 1 -  Suicidal thoughts - - - 0 -  PHQ-9 Score - - - 11 -  Difficult doing work/chores - - - Very difficult -     Review of Systems  Constitutional: Negative.   HENT: Negative.   Eyes: Negative.   Respiratory: Negative.   Cardiovascular: Negative.   Gastrointestinal: Negative.   Endocrine: Negative.   Genitourinary: Negative.   Musculoskeletal: Positive for arthralgias, myalgias, neck pain and neck stiffness.  Skin: Negative.   Allergic/Immunologic: Negative.   Neurological: Positive for numbness.  Hematological: Negative.   Psychiatric/Behavioral: Positive for dysphoric mood.  All other systems reviewed and are negative.      Objective:   Physical Exam Gen: NAD. Vital signs reviewed HENT: Normocephalic, Atraumatic Eyes: EOMI. No discharge.  Cardio: RRR. No JVD. Pulm: B/l clear to auscultation.  Effort normal Abd: Nondistended, BS+ MSK:  Gait WNL.   No TTP left shoulder.    No edema.  Neuro:  Sensation diminished to light touch C6-T1 RUE  Strength  4/5 in all RUE myotomes (some pain inhibition)    4-/5 in all LUE myotomes (pain inhibition) Skin: Warm and Dry. Intact Psych: Restless with involuntary movements    Assessment & Plan:  32 y/o with pmh of Bipolar disorder, asthma, anxiety/depression, ADHD present with neck pain.   1. Chronic neck pain due radiculopathy  MRI from 2017 reviewed, revealing Right C4 impingement  NCS/EMG 2014 suggesting chronic C6-T1 b/l radiculopathy  Cont Heat  Encouraged trial Lidoderm OTC patch, states cannot afford at present  Cont Gabapentin to 300 TID  Per pt, per Psych, will not change Fluoxetine to Cymbalta  Cont Baclofen 20 TID  Will consider Mobic in future  Will order PT with trial of TENS and traction, with consideration of massage therapy  Will consider NCS/EMG  Will not prescribe  narcotic given pmh and hx of marijuana use, and opiod risk score  Vit B12 WNL  Will order UDS   2. Sleep disturbance  Cont elavil to 25mg  qhs  3. Morbid Obesity  Not interested in seeing dietitian   Cont weight loss  4. Myalgia   Will consider trigger point injections in future  5. Left shoulder  pain  Xray reviewed, unremarkable  6. Vit D deficiency  Vit D 33 on 8/8  Cont supplementation  7. ADHD/Bipolar/Anxiety/Depression   Follow up with Psych  Patient restless with involuntary movements today,, states she has not been taking her ADHD meds.  States this happens sometimes

## 2018-09-21 ENCOUNTER — Encounter: Payer: Medicaid Other | Admitting: Physical Medicine & Rehabilitation

## 2018-11-05 ENCOUNTER — Emergency Department (HOSPITAL_COMMUNITY)
Admission: EM | Admit: 2018-11-05 | Discharge: 2018-11-05 | Disposition: A | Discharge status: Home / Self Care | Payer: Medicaid Other | Attending: Emergency Medicine | Admitting: Emergency Medicine

## 2018-11-05 ENCOUNTER — Emergency Department (HOSPITAL_COMMUNITY): Payer: Medicaid Other

## 2018-11-05 ENCOUNTER — Encounter (HOSPITAL_COMMUNITY): Payer: Self-pay

## 2018-11-05 DIAGNOSIS — Z79899 Other long term (current) drug therapy: Secondary | ICD-10-CM | POA: Insufficient documentation

## 2018-11-05 DIAGNOSIS — E7800 Pure hypercholesterolemia, unspecified: Secondary | ICD-10-CM | POA: Diagnosis not present

## 2018-11-05 DIAGNOSIS — F17210 Nicotine dependence, cigarettes, uncomplicated: Secondary | ICD-10-CM | POA: Insufficient documentation

## 2018-11-05 DIAGNOSIS — R059 Cough, unspecified: Secondary | ICD-10-CM

## 2018-11-05 DIAGNOSIS — J40 Bronchitis, not specified as acute or chronic: Secondary | ICD-10-CM | POA: Insufficient documentation

## 2018-11-05 DIAGNOSIS — R079 Chest pain, unspecified: Secondary | ICD-10-CM | POA: Diagnosis present

## 2018-11-05 DIAGNOSIS — R05 Cough: Secondary | ICD-10-CM

## 2018-11-05 DIAGNOSIS — J4520 Mild intermittent asthma, uncomplicated: Secondary | ICD-10-CM | POA: Insufficient documentation

## 2018-11-05 LAB — SALICYLATE LEVEL: Salicylate Lvl: <7 mg/dL (ref 2.8–30.0)

## 2018-11-05 LAB — COMPREHENSIVE METABOLIC PANEL
ALT: 21 U/L (ref 0–44)
AST: 27 U/L (ref 15–41)
Albumin: 4.2 g/dL (ref 3.5–5.0)
Alkaline Phosphatase: 57 U/L (ref 38–126)
Anion gap: 13 (ref 5–15)
BUN: 6 mg/dL (ref 6–20)
CO2: 22 mmol/L (ref 22–32)
Calcium: 9.2 mg/dL (ref 8.9–10.3)
Chloride: 109 mmol/L (ref 98–111)
Creatinine, Ser: 0.71 mg/dL (ref 0.44–1.00)
GFR calc Af Amer: >60 mL/min (ref >60)
GFR calc non Af Amer: >60 mL/min (ref >60)
Glucose, Bld: 80 mg/dL (ref 70–99)
Potassium: 3 mmol/L — ABNORMAL LOW (ref 3.5–5.1)
Sodium: 144 mmol/L (ref 135–145)
Total Bilirubin: 1.2 mg/dL (ref 0.3–1.2)
Total Protein: 7.3 g/dL (ref 6.5–8.1)

## 2018-11-05 LAB — CBC WITH DIFFERENTIAL/PLATELET
Abs Immature Granulocytes: 0.08 10*3/uL — ABNORMAL HIGH (ref 0.00–0.07)
BASOS ABS: 0.1 10*3/uL (ref 0.0–0.1)
Basophils Relative: 1 %
EOS ABS: 0.3 10*3/uL (ref 0.0–0.5)
EOS PCT: 2 %
HEMATOCRIT: 43.7 % (ref 36.0–46.0)
HEMOGLOBIN: 15 g/dL (ref 12.0–15.0)
Immature Granulocytes: 1 %
LYMPHS ABS: 4.4 10*3/uL — AB (ref 0.7–4.0)
LYMPHS PCT: 26 %
MCH: 31.2 pg (ref 26.0–34.0)
MCHC: 34.3 g/dL (ref 30.0–36.0)
MCV: 90.9 fL (ref 80.0–100.0)
MONOS PCT: 9 %
Monocytes Absolute: 1.5 10*3/uL — ABNORMAL HIGH (ref 0.1–1.0)
NRBC: 0 % (ref 0.0–0.2)
Neutro Abs: 10.7 10*3/uL — ABNORMAL HIGH (ref 1.7–7.7)
Neutrophils Relative %: 61 %
Platelets: 324 10*3/uL (ref 150–400)
RBC: 4.81 MIL/uL (ref 3.87–5.11)
RDW: 12.2 % (ref 11.5–15.5)
WBC: 16.9 10*3/uL — ABNORMAL HIGH (ref 4.0–10.5)

## 2018-11-05 LAB — TROPONIN I: Troponin I: <0.03 ng/mL (ref <0.03)

## 2018-11-05 LAB — ETHANOL: Alcohol, Ethyl (B): <10 mg/dL (ref <10)

## 2018-11-05 LAB — ACETAMINOPHEN LEVEL: Acetaminophen (Tylenol), Serum: <10 ug/mL — ABNORMAL LOW (ref 10–30)

## 2018-11-05 MED ORDER — ALBUTEROL SULFATE HFA 108 (90 BASE) MCG/ACT IN AERS
1.0000 | INHALATION_SPRAY | Freq: Once | RESPIRATORY_TRACT | Status: AC
  Administered 2018-11-05: 1 via RESPIRATORY_TRACT
  Filled 2018-11-05: qty 6.7

## 2018-11-05 MED ORDER — HALOPERIDOL LACTATE 5 MG/ML IJ SOLN
2.0000 mg | Freq: Once | INTRAMUSCULAR | Status: DC
  Filled 2018-11-05: qty 1

## 2018-11-05 MED ORDER — LORAZEPAM 2 MG/ML IJ SOLN: 1.0000 mg | Freq: Once | INTRAMUSCULAR | Status: DC

## 2018-11-05 MED ORDER — BENZONATATE 100 MG PO CAPS: 100.0000 mg | ORAL_CAPSULE | Freq: Three times a day (TID) | ORAL | 0 refills | Status: DC

## 2018-11-05 MED ORDER — SODIUM CHLORIDE 0.9 % IV BOLUS
1000.0000 mL | Freq: Once | INTRAVENOUS | Status: AC
  Administered 2018-11-05: 1000 mL via INTRAVENOUS

## 2018-11-05 MED ORDER — LORAZEPAM 2 MG/ML IJ SOLN
1.0000 mg | Freq: Once | INTRAMUSCULAR | Status: DC
  Filled 2018-11-05: qty 1

## 2018-11-05 MED ORDER — DIPHENHYDRAMINE HCL 50 MG/ML IJ SOLN
25.0000 mg | Freq: Once | INTRAMUSCULAR | Status: DC
  Filled 2018-11-05: qty 1

## 2018-11-05 NOTE — ED Triage Notes (Signed)
Pt from home via [facility]; c/o CP, cough, and sob that began after doing cocaine this past Thursday; pupils 5, reactive w/ ems; EKG unremarkable w/ ems; pt very agitated on arrival, won't sit still; pt given 324 ASA and 500 ml NS PTA; denies drug use today  134/87 HR 130 RR 26

## 2018-11-05 NOTE — ED Notes (Signed)
Patient verbalizes understanding of discharge instructions. Opportunity for questioning and answers were provided. Pt discharged from ED. 

## 2018-11-05 NOTE — ED Provider Notes (Signed)
[facility] Provider Note   CSN: [id number] Arrival date & time: 11/05/18  1004     History   Chief Complaint Chief Complaint  Patient presents with  . Chest Pain  . Shortness of Breath    HPI Kaleen Maskndrea Silas is a 32 y.o. female.  The history is provided by the patient, medical records and the EMS personnel. No language interpreter was used.  Chest Pain   Associated symptoms include cough and shortness of breath.  Shortness of Breath  Associated symptoms include cough and chest pain.   Kaleen Maskndrea Ortman is a 32 year old female with history of hyperlipidemia, asthma, anxiety, bipolar disorder who presents the emergency department complaining of cough for several days along with chest pain and shortness of breath which began after doing cocaine on Thursday.  She felt as if her symptoms should be getting better by now, but have been persistent. She states that she currently is not having any chest pain or shortness of breath right now. She feels as if she has congestion in her chest.  She continues to be coughing throughout examination.  She states that she has not taken any medication prescribed or illicit since Thursday.   Past Medical History:  Diagnosis Date  . ADHD   . Anxiety   . Asthma   . Bipolar 1 disorder, depressed (HCC)   . Hypercholesteremia   . Pinched nerve in neck    C 6-7    Patient Active Problem List   Diagnosis Date Noted  . Chronic pain syndrome 08/10/2018  . Acute pain of left shoulder 07/21/2018  . Plantar wart of left foot 03/20/2018  . Numbness and tingling of right hand 12/28/2017  . Mild intermittent asthma without complication 12/28/2017  . Bipolar affective disorder (HCC) 12/28/2017  . Smoking trying to quit 12/28/2017    Past Surgical History:  Procedure Laterality Date  . ADENOIDECTOMY    . CESAREAN SECTION    . CYSTECTOMY     on overy  . TONSILLECTOMY    . TUBAL LIGATION    . WISDOM TOOTH  EXTRACTION       OB History    Gravida  3   Para  3   Term      Preterm      AB      Living        SAB      TAB      Ectopic      Multiple      Live Births               Home Medications    Prior to Admission medications   Medication Sig Start Date End Date Taking? Authorizing Provider  amitriptyline (ELAVIL) 25 MG tablet Take 1 tablet (25 mg total) by mouth at bedtime. 07/13/18   Marcello FennelPatel, Ankit Anil, MD  baclofen (LIORESAL) 20 MG tablet Take 1 tablet (20 mg total) by mouth 3 (three) times daily. 07/13/18   Marcello FennelPatel, Ankit Anil, MD  benzonatate (TESSALON) 100 MG capsule Take 1 capsule (100 mg total) by mouth every 8 (eight) hours. 11/05/18   Charina Fons, Chase PicketJaime Pilcher, PA-C  FLUoxetine (PROZAC) 20 MG tablet Take 30 mg by mouth daily.    [provider]  gabapentin (NEURONTIN) 300 MG capsule Take 1 capsule (300 mg total) by mouth 3 (three) times daily. 08/10/18   Marcello FennelPatel, Ankit Anil, MD  ibuprofen (ADVIL,MOTRIN) 200 MG tablet Take 400 mg by mouth every 4 (four) hours  as needed.    [provider]  lamoTRIgine (LAMICTAL) 25 MG tablet Take 50 mg by mouth 2 (two) times daily.    [provider]  lisdexamfetamine (VYVANSE) 20 MG capsule Take 20 mg by mouth daily.    [provider]  Melatonin 10 MG TABS Take 10 mg by mouth at bedtime.    [provider]    Family History Family History  Problem Relation Age of Onset  . Heart disease Maternal Grandmother     Social History Social History   Tobacco Use  . Smoking status: Current Every Day Smoker    Packs/day: 0.75    Types: Cigarettes    Start date: 12/26/2006  . Smokeless tobacco: Never Used  Substance Use Topics  . Alcohol use: Not Currently  . Drug use: Yes    Types: Marijuana, Cocaine     Allergies   Ceclor [cefaclor]   Review of Systems Review of Systems  HENT: Positive for congestion.   Respiratory: Positive for cough and shortness of breath.   Cardiovascular:  Positive for chest pain.  All other systems reviewed and are negative.    Physical Exam Updated Vital Signs BP (!) 131/100   Pulse (!) 106   Temp 99 F (37.2 C) (Oral)   Resp (!) 24   Ht 5\' 4"  (1.626 m)   Wt 72.6 kg   SpO2 97%   BMI 27.46 kg/m   Physical Exam Vitals signs and nursing note reviewed.  Constitutional:      Appearance: She is well-developed. She is not ill-appearing or toxic-appearing.  HENT:     Head: Normocephalic and atraumatic.  Cardiovascular:     Heart sounds: Normal heart sounds. No murmur.     Comments: Tachycardic, but regular. Pulmonary:     Effort: Pulmonary effort is normal. No respiratory distress.  Abdominal:     General: There is no distension.     Palpations: Abdomen is soft.     Tenderness: There is no abdominal tenderness.  Skin:    General: Skin is warm and dry.  Neurological:     Mental Status: She is alert and oriented to person, place, and time.      ED Treatments / Results  Labs (all labs ordered are listed, but only abnormal results are displayed) Labs Reviewed  CBC WITH DIFFERENTIAL/PLATELET - Abnormal; Notable for the following components:      Result Value   WBC 16.9 (*)    Neutro Abs 10.7 (*)    Lymphs Abs 4.4 (*)    Monocytes Absolute 1.5 (*)    Abs Immature Granulocytes 0.08 (*)    All other components within normal limits  COMPREHENSIVE METABOLIC PANEL - Abnormal; Notable for the following components:   Potassium 3.0 (*)    All other components within normal limits  ACETAMINOPHEN LEVEL - Abnormal; Notable for the following components:   Acetaminophen (Tylenol), Serum <10 (*)    All other components within normal limits  SALICYLATE LEVEL  ETHANOL  TROPONIN I    EKG EKG Interpretation  Date/Time:  Sunday November 05 2018 12:17:23 EST Ventricular Rate:  117 PR Interval:    QRS Duration: 119 QT Interval:  341 QTC Calculation: 476 R Axis:   139 Text Interpretation:  Sinus tachycardia Probable left atrial  enlargement Nonspecific intraventricular conduction delay Low voltage, precordial leads Baseline wander in lead(s) I II aVR aVF V6 Confirmed by Lorre NickAllen, Anthony ([id number]) on 11/05/2018 12:43:00 PM   Radiology Dg Chest 2 View  Result Date: 11/05/2018 CLINICAL DATA:  Pt Is having SOB for five days now. She has a cough with fever. Cough and nose are producing with white phlegm. She stated she is dehydrated and cannot sleep. She has asthma. Headaches. EXAM: CHEST - 2 VIEW COMPARISON:  None. FINDINGS: Heart size is normal. There is perihilar peribronchial thickening. There are no focal consolidations or pleural effusions. No pulmonary edema. IMPRESSION: 1. Bronchitic changes. 2. No focal acute pulmonary abnormality. For clinical Electronically Signed   By: Norva Pavlov M.D.   On: 11/05/2018 12:25    Procedures Procedures (including critical care time)  Medications Ordered in ED Medications  LORazepam (ATIVAN) injection 1 mg (1 mg Intravenous Not Given 11/05/18 1223)  diphenhydrAMINE (BENADRYL) injection 25 mg (25 mg Intravenous Not Given 11/05/18 1223)  haloperidol lactate (HALDOL) injection 2 mg (2 mg Intravenous Refused 11/05/18 1223)  sodium chloride 0.9 % bolus 1,000 mL (0 mLs Intravenous Stopped 11/05/18 1054)  albuterol (PROVENTIL HFA;VENTOLIN HFA) 108 (90 Base) MCG/ACT inhaler 1-2 puff (1 puff Inhalation Provided for home use 11/05/18 1245)     Initial Impression / Assessment and Plan / ED Course  I have reviewed the triage vital signs and the nursing notes.  Pertinent labs & imaging results that were available during my care of the patient were reviewed by me and considered in my medical decision making (see chart for details).    Lakara Weiland is a 32 y.o. female who presents to ED for cough, congestion, central chest pain and shortness of breath for a few days.  She believes symptoms all started after doing a lot of cocaine a few days ago.  She denies any cocaine use today, however  she is quite anxious and stimulated on exam with dilated pupils.  Afebrile.  Lungs sound clear.  She does continue to have coughing fits during my exam.   Upon arrival via EMS, when we attempted to place patient on cardiac monitoring, she stated that she no longer wanted to be here in 1 to leave.  I had a AMA discussion with her.  A few minutes later, she was agreeable to staying for further testing, however declined any type of medication or fluids as we had planned.  She is hypokalemic at 3.0, but again refuses to take any potassium supplementation.  Refused IV fluids for mild tachycardia in the 100s.  Troponin is negative.  White count is elevated at 16.9.  Chest x-ray shows signs of bronchitis without focal consolidation concerning for pneumonia.  EKG without acute ischemic changes.  Discussed once again about idea of medications and monitoring heart rate after meds/fluids, however patient requesting to leave against my advice.  Albuterol inhaler provided.  Strongly encouraged that she follow-up with her PCP.   Patient discussed with Dr. Freida Busman who agrees with treatment plan.     Final Clinical Impressions(s) / ED Diagnoses   Final diagnoses:  Cough  Bronchitis    ED Discharge Orders         Ordered    benzonatate (TESSALON) 100 MG capsule  Every 8 hours     11/05/18 1240           Carleton [place], Chase Picket, PA-C 11/05/18 1248    Lorre Nick, MD 11/06/18 1455

## 2018-11-05 NOTE — ED Notes (Signed)
Pt expressing that she wants to leave; PA aware

## 2018-11-05 NOTE — Discharge Instructions (Addendum)
Use inhaler every 4-6 hours as needed for cough / shortness of breath.  Tessalon as needed for cough.  Call your doctor tomorrow to schedule a follow up appointment for recheck in 2-3 days.  Return to ER for new or worsening symptoms, any additional concerns.

## 2018-11-05 NOTE — ED Notes (Addendum)
Explained concern regarding elevated white count; pt agreeable to cxr; x ray notified that pt is ready for transport

## 2019-03-01 ENCOUNTER — Encounter: Payer: Self-pay | Admitting: Family Medicine

## 2019-04-13 ENCOUNTER — Ambulatory Visit: Payer: Medicaid Other | Admitting: Family Medicine

## 2019-05-21 ENCOUNTER — Encounter: Payer: Self-pay | Admitting: Family Medicine

## 2019-05-22 ENCOUNTER — Encounter: Payer: Self-pay | Admitting: Family Medicine

## 2019-05-24 ENCOUNTER — Ambulatory Visit: Payer: Medicaid Other | Admitting: Family Medicine

## 2019-05-24 ENCOUNTER — Other Ambulatory Visit: Payer: Self-pay

## 2019-05-24 ENCOUNTER — Ambulatory Visit (INDEPENDENT_AMBULATORY_CARE_PROVIDER_SITE_OTHER): Payer: Medicaid Other | Admitting: Licensed Clinical Social Worker

## 2019-05-24 ENCOUNTER — Encounter: Payer: Self-pay | Admitting: Family Medicine

## 2019-05-24 DIAGNOSIS — Z789 Other specified health status: Secondary | ICD-10-CM

## 2019-05-24 DIAGNOSIS — F1129 Opioid dependence with unspecified opioid-induced disorder: Secondary | ICD-10-CM | POA: Diagnosis not present

## 2019-05-24 NOTE — BH Specialist Note (Signed)
   Consult Note  05/24/2019 Name: Sandra Garza MRN: [id number] DOB: 07/06/1986  Referred by: Shirley, [place], DO Reason for referral : [facility]  Sandra Garza is a 33 y.o. year old female who sees [place], [place], DO for primary care.  LCSW received consult to assist patient with  Locating substance treatment resources.    LSCW met with patient to assess needs and barriers to connecting with substance agencies.  Patient received resources provided by PCP.  She has not made calls.  Patient wants PCP to treat her at our practice.  Reviewed benefits of going to substance treatment facilities, reviewed list of substance providers to identify two providers that would take patient's insurance and in close proximity of her home.  Plan:Patient states she will call agency close to her home. Update provided to PCP.  Casimer Lanius, [facility]   [phone] 3:46 PM

## 2019-05-27 ENCOUNTER — Encounter: Payer: Self-pay | Admitting: Family Medicine

## 2019-05-29 MED ORDER — GABAPENTIN 300 MG PO CAPS: 300.0000 mg | ORAL_CAPSULE | Freq: Three times a day (TID) | ORAL | 1 refills | Status: DC

## 2019-05-30 DIAGNOSIS — F1120 Opioid dependence, uncomplicated: Secondary | ICD-10-CM | POA: Insufficient documentation

## 2019-05-30 NOTE — Progress Notes (Signed)
  Subjective:  Patient ID: Sandra Garza  DOB: 11/07/1986 MRN: [id number]  Sandra Garza is a 33 y.o. female with a PMH of asthma, Bipolar, chronic pain,  H/o polysubstance abuse, h/o tobacco use disorder here today for subutex rx or referral.   HPI:  Opioid dependece: - patient previously reached out via mychart to ask about calling in a subutex and for help regarding her opiate addiction. She was provided with resources and informed we do prescribe the medication here, however she was confused when I messaged her stating we did not prescribe subutex or suboxone here, and presented today for a prescription.  - she is figitty and appears agitated, and asked to leave when I said I could not prescribe the medication. She said she had the resources and could reach out. Patient's mom drove her and is waiting int he car. I asked her if she was safe to leave and she said yes, she has no intention of SI/HI and she just thought we could give her the medication today.  - she met with BH today  ROS: as mentioned in HPI   Patient Active Problem List   Diagnosis Date Noted  . Opiate addiction ([place]) 05/30/2019  . Chronic pain syndrome 08/10/2018  . Numbness and tingling of right hand 12/28/2017  . Mild intermittent asthma without complication 36/04/7702  . Bipolar affective disorder ([place]) 12/28/2017  . Smoking trying to quit 12/28/2017     Objective:  BP 110/80   Pulse 95   Ht '5\' 4"'$  (1.626 m)   Wt 151 lb 6 oz (68.7 kg)   LMP 04/28/2019   SpO2 97%   BMI 25.98 kg/m   Vitals and nursing note reviewed  General: NAD, figitting, agitated Pulm: normal effort Extremities: no edema or cyanosis. WWP. Skin: warm and dry, no rashes noted Neuro: alert and oriented, no focal deficits Psych: Appropriately dressed. Does not maintain good eye contact. Speech is normal volume and rate, but is pressured. Denies SI/ HI.   Assessment & Plan:   Opiate addiction [facility]) Reviewed benefits of going to  substance treatment facilities, patient reports understanding of  of substance providers. Spoke with [facility] prior to my entering room and patient quickly left after I told her we did not prescribe subutex. Did report that her mom was with her and she was safe to leave and had no SI/HI.    [place] Verneda Hollopeter, DO Family Medicine Resident PGY-3

## 2019-05-30 NOTE — Assessment & Plan Note (Signed)
Reviewed benefits of going to substance treatment facilities, patient reports understanding of  of substance providers. Spoke with [place] prior to my entering room and patient quickly left after I told her we did not prescribe subutex. Did report that her mom was with her and she was safe to leave and had no SI/HI.

## 2019-08-31 ENCOUNTER — Encounter: Payer: Self-pay | Admitting: Family Medicine

## 2019-09-04 ENCOUNTER — Other Ambulatory Visit: Payer: Self-pay | Admitting: Family Medicine

## 2019-09-04 DIAGNOSIS — F1129 Opioid dependence with unspecified opioid-induced disorder: Secondary | ICD-10-CM

## 2020-10-06 ENCOUNTER — Other Ambulatory Visit: Payer: Self-pay

## 2020-10-06 ENCOUNTER — Encounter (HOSPITAL_COMMUNITY): Payer: Self-pay | Admitting: *Deleted

## 2020-10-06 ENCOUNTER — Emergency Department (HOSPITAL_COMMUNITY): Payer: Medicaid Other

## 2020-10-06 ENCOUNTER — Emergency Department (HOSPITAL_COMMUNITY)
Admission: EM | Admit: 2020-10-06 | Discharge: 2020-10-07 | Disposition: A | Discharge status: Home / Self Care | Payer: Medicaid Other | Attending: Emergency Medicine | Admitting: Emergency Medicine

## 2020-10-06 DIAGNOSIS — F1910 Other psychoactive substance abuse, uncomplicated: Secondary | ICD-10-CM | POA: Insufficient documentation

## 2020-10-06 DIAGNOSIS — R451 Restlessness and agitation: Secondary | ICD-10-CM | POA: Insufficient documentation

## 2020-10-06 DIAGNOSIS — F17210 Nicotine dependence, cigarettes, uncomplicated: Secondary | ICD-10-CM | POA: Insufficient documentation

## 2020-10-06 DIAGNOSIS — F29 Unspecified psychosis not due to a substance or known physiological condition: Secondary | ICD-10-CM | POA: Insufficient documentation

## 2020-10-06 DIAGNOSIS — R000 Tachycardia, unspecified: Secondary | ICD-10-CM | POA: Diagnosis not present

## 2020-10-06 DIAGNOSIS — J4520 Mild intermittent asthma, uncomplicated: Secondary | ICD-10-CM | POA: Insufficient documentation

## 2020-10-06 DIAGNOSIS — R462 Strange and inexplicable behavior: Secondary | ICD-10-CM | POA: Diagnosis present

## 2020-10-06 HISTORY — DX: Other psychoactive substance abuse, uncomplicated: F19.10

## 2020-10-06 LAB — CBC WITH DIFFERENTIAL/PLATELET
Abs Immature Granulocytes: 0.05 10*3/uL (ref 0.00–0.07)
Basophils Absolute: 0.1 10*3/uL (ref 0.0–0.1)
Basophils Relative: 1 %
Eosinophils Absolute: 0 10*3/uL (ref 0.0–0.5)
Eosinophils Relative: 0 %
HCT: 45.3 % (ref 36.0–46.0)
Hemoglobin: 15.6 g/dL — ABNORMAL HIGH (ref 12.0–15.0)
Immature Granulocytes: 0 %
Lymphocytes Relative: 27 %
Lymphs Abs: 4 10*3/uL (ref 0.7–4.0)
MCH: 29.7 pg (ref 26.0–34.0)
MCHC: 34.4 g/dL (ref 30.0–36.0)
MCV: 86.1 fL (ref 80.0–100.0)
Monocytes Absolute: 1.5 10*3/uL — ABNORMAL HIGH (ref 0.1–1.0)
Monocytes Relative: 10 %
Neutro Abs: 9 10*3/uL — ABNORMAL HIGH (ref 1.7–7.7)
Neutrophils Relative %: 62 %
Platelets: 348 10*3/uL (ref 150–400)
RBC: 5.26 MIL/uL — ABNORMAL HIGH (ref 3.87–5.11)
RDW: 12.9 % (ref 11.5–15.5)
WBC: 14.6 10*3/uL — ABNORMAL HIGH (ref 4.0–10.5)
nRBC: 0 % (ref 0.0–0.2)

## 2020-10-06 LAB — COMPREHENSIVE METABOLIC PANEL
ALT: 17 U/L (ref 0–44)
AST: 19 U/L (ref 15–41)
Albumin: 4.7 g/dL (ref 3.5–5.0)
Alkaline Phosphatase: 63 U/L (ref 38–126)
Anion gap: 13 (ref 5–15)
BUN: 15 mg/dL (ref 6–20)
CO2: 23 mmol/L (ref 22–32)
Calcium: 9.2 mg/dL (ref 8.9–10.3)
Chloride: 105 mmol/L (ref 98–111)
Creatinine, Ser: 0.75 mg/dL (ref 0.44–1.00)
GFR, Estimated: >60 mL/min (ref >60)
Glucose, Bld: 84 mg/dL (ref 70–99)
Potassium: 4.6 mmol/L (ref 3.5–5.1)
Sodium: 141 mmol/L (ref 135–145)
Total Bilirubin: 0.7 mg/dL (ref 0.3–1.2)
Total Protein: 8.4 g/dL — ABNORMAL HIGH (ref 6.5–8.1)

## 2020-10-06 LAB — ACETAMINOPHEN LEVEL: Acetaminophen (Tylenol), Serum: <10 ug/mL — ABNORMAL LOW (ref 10–30)

## 2020-10-06 LAB — SALICYLATE LEVEL: Salicylate Lvl: <7 mg/dL — ABNORMAL LOW (ref 7.0–30.0)

## 2020-10-06 LAB — ETHANOL: Alcohol, Ethyl (B): <10 mg/dL (ref <10)

## 2020-10-06 MED ORDER — STERILE WATER FOR INJECTION IJ SOLN
INTRAMUSCULAR | Status: AC
  Filled 2020-10-06: qty 10

## 2020-10-06 MED ORDER — LORAZEPAM 2 MG/ML IJ SOLN
1.0000 mg | Freq: Once | INTRAMUSCULAR | Status: AC
  Administered 2020-10-06: 1 mg via INTRAVENOUS
  Filled 2020-10-06: qty 1

## 2020-10-06 MED ORDER — LORAZEPAM 2 MG/ML IJ SOLN
INTRAMUSCULAR | Status: AC
  Administered 2020-10-06: 1 mg via INTRAVENOUS
  Filled 2020-10-06: qty 1

## 2020-10-06 MED ORDER — ZIPRASIDONE MESYLATE 20 MG IM SOLR: 20.0000 mg | Freq: Once | INTRAMUSCULAR | Status: AC

## 2020-10-06 MED ORDER — ZIPRASIDONE MESYLATE 20 MG IM SOLR
INTRAMUSCULAR | Status: AC
  Administered 2020-10-06: 20 mg via INTRAMUSCULAR
  Filled 2020-10-06: qty 20

## 2020-10-06 MED ORDER — LORAZEPAM 2 MG/ML IJ SOLN: 1.0000 mg | Freq: Once | INTRAMUSCULAR | Status: AC

## 2020-10-06 MED ORDER — SODIUM CHLORIDE 0.9 % IV BOLUS
1000.0000 mL | Freq: Once | INTRAVENOUS | Status: AC
  Administered 2020-10-06: 1000 mL via INTRAVENOUS

## 2020-10-06 NOTE — ED Notes (Signed)
Pt arrived via EMS, found on street, under influence of crack cocaine and heroin per pt. Pt cont with cont movement, unable to sit still, not making sense.

## 2020-10-06 NOTE — ED Triage Notes (Signed)
BIB EMS [place] acting strange, hyperactivity, wanting water, pt highly agitated. Admits to Heroin, Meth and cocaine use today. Behavior become worse after boyfriend gave her some Cocaine.

## 2020-10-06 NOTE — ED Provider Notes (Signed)
[facility]-EMERGENCY DEPT Provider Note   CSN: [id number] Arrival date & time: 10/06/20  1827     History Chief Complaint  Patient presents with  . Addiction Problem    Sandra Garza is a 34 y.o. female.  Presents to ER with abnormal behavior.  History limited due to altered mental status.  EMS reports patient had admitted to heroin, meth, cocaine use.  Per boyfriend behavior changed after doing the cocaine.  Patient does not answer questions appropriately, does not intelligently respond when asked medical questions.  HPI     Past Medical History:  Diagnosis Date  . ADHD   . Anxiety   . Asthma   . Bipolar 1 disorder, depressed (HCC)   . Drug abuse (HCC)   . Hypercholesteremia   . Pinched nerve in neck    C 6-7    Patient Active Problem List   Diagnosis Date Noted  . Opiate addiction (HCC) 05/30/2019  . Chronic pain syndrome 08/10/2018  . Numbness and tingling of right hand 12/28/2017  . Mild intermittent asthma without complication 12/28/2017  . Bipolar affective disorder (HCC) 12/28/2017  . Smoking trying to quit 12/28/2017    Past Surgical History:  Procedure Laterality Date  . ADENOIDECTOMY    . CESAREAN SECTION    . CYSTECTOMY     on overy  . TONSILLECTOMY    . TUBAL LIGATION    . WISDOM TOOTH EXTRACTION       OB History    Gravida  3   Para  3   Term      Preterm      AB      Living        SAB      TAB      Ectopic      Multiple      Live Births              Family History  Problem Relation Age of Onset  . Heart disease Maternal Grandmother     Social History   Tobacco Use  . Smoking status: Current Every Day Smoker    Packs/day: 0.75    Types: Cigarettes    Start date: 12/26/2006  . Smokeless tobacco: Never Used  Vaping Use  . Vaping Use: Never used  Substance Use Topics  . Alcohol use: Not Currently  . Drug use: Yes    Types: Marijuana, Cocaine, Methamphetamines    Home  Medications Prior to Admission medications   Medication Sig Start Date End Date Taking? Authorizing Provider  amitriptyline (ELAVIL) 25 MG tablet Take 1 tablet (25 mg total) by mouth at bedtime. Patient not taking: Reported on 10/06/2020 07/13/18   Marcello Fennel, MD  baclofen (LIORESAL) 20 MG tablet Take 1 tablet (20 mg total) by mouth 3 (three) times daily. Patient not taking: Reported on 10/06/2020 07/13/18   Marcello Fennel, MD  benzonatate (TESSALON) 100 MG capsule Take 1 capsule (100 mg total) by mouth every 8 (eight) hours. Patient not taking: Reported on 10/06/2020 11/05/18   Ward, Chase Picket, PA-C  FLUoxetine (PROZAC) 20 MG tablet Take 30 mg by mouth daily. Patient not taking: Reported on 10/06/2020    [provider]  gabapentin (NEURONTIN) 300 MG capsule Take 1 capsule (300 mg total) by mouth 3 (three) times daily. Patient not taking: Reported on 10/06/2020 05/29/19   Shirley, [place], DO  ibuprofen (ADVIL,MOTRIN) 200 MG tablet Take 400 mg by mouth every 4 (four) hours as  needed. Patient not taking: Reported on 10/06/2020    [provider]  lamoTRIgine (LAMICTAL) 25 MG tablet Take 50 mg by mouth 2 (two) times daily. Patient not taking: Reported on 10/06/2020    [provider]  lisdexamfetamine (VYVANSE) 20 MG capsule Take 20 mg by mouth daily. Patient not taking: Reported on 10/06/2020    [provider]  Melatonin 10 MG TABS Take 10 mg by mouth at bedtime. Patient not taking: Reported on 10/06/2020    [provider]    Allergies    Ceclor [cefaclor]  Review of Systems   Review of Systems  Unable to perform ROS: Mental status change    Physical Exam Updated Vital Signs BP (!) 139/106   Pulse (!) 112   Temp 98.3 F (36.8 C) (Oral)   Resp 20   SpO2 98%   Physical Exam Vitals and nursing note reviewed.  Constitutional:      Appearance: She is well-developed.     Comments: Extremely agitated, thrashing in bed  HENT:      Head: Normocephalic and atraumatic.  Eyes:     Conjunctiva/sclera: Conjunctivae normal.  Cardiovascular:     Rate and Rhythm: Regular rhythm. Tachycardia present.     Heart sounds: No murmur heard.   Pulmonary:     Effort: Pulmonary effort is normal. No respiratory distress.     Breath sounds: Normal breath sounds.  Abdominal:     Palpations: Abdomen is soft.     Tenderness: There is no abdominal tenderness.  Musculoskeletal:     Cervical back: Neck supple.  Skin:    General: Skin is warm and dry.  Neurological:     Mental Status: She is alert.     Comments: Alert, moving all 4 extremities spontaneously, confused verbal response     ED Results / Procedures / Treatments   Labs (all labs ordered are listed, but only abnormal results are displayed) Labs Reviewed  CBC WITH DIFFERENTIAL/PLATELET - Abnormal; Notable for the following components:      Result Value   WBC 14.6 (*)    RBC 5.26 (*)    Hemoglobin 15.6 (*)    Neutro Abs 9.0 (*)    Monocytes Absolute 1.5 (*)    All other components within normal limits  COMPREHENSIVE METABOLIC PANEL - Abnormal; Notable for the following components:   Total Protein 8.4 (*)    All other components within normal limits  ACETAMINOPHEN LEVEL - Abnormal; Notable for the following components:   Acetaminophen (Tylenol), Serum <10 (*)    All other components within normal limits  SALICYLATE LEVEL - Abnormal; Notable for the following components:   Salicylate Lvl <7.0 (*)    All other components within normal limits  ETHANOL  RAPID URINE DRUG SCREEN, HOSP PERFORMED  HCG, SERUM, QUALITATIVE    EKG EKG Interpretation  Date/Time:  Monday October 06 2020 19:28:34 EST Ventricular Rate:  135 PR Interval:    QRS Duration: 102 QT Interval:  302 QTC Calculation: 453 R Axis:   71 Text Interpretation: Sinus tachycardia Confirmed by Marianna Fuss ([id number]) on 10/06/2020 7:55:34 PM   Radiology No results  found.  Procedures .Critical Care Performed by: Milagros Loll, MD Authorized by: Milagros Loll, MD   Critical care provider statement:    Critical care time (minutes):  34   Critical care was necessary to treat or prevent imminent or life-threatening deterioration of the following conditions: psychosis.   Critical care was time spent personally by  me on the following activities:  Discussions with consultants, evaluation of patient's response to treatment, examination of patient, ordering and performing treatments and interventions, ordering and review of laboratory studies, ordering and review of radiographic studies, pulse oximetry, re-evaluation of patient's condition, obtaining history from patient or surrogate and review of old charts   (including critical care time)  Medications Ordered in ED Medications  LORazepam (ATIVAN) injection 1 mg (has no administration in time range)  LORazepam (ATIVAN) injection 1 mg (1 mg Intravenous Given 10/06/20 1833)  LORazepam (ATIVAN) injection 1 mg (1 mg Intravenous Given 10/06/20 1853)  ziprasidone (GEODON) injection 20 mg (20 mg Intramuscular Given 10/06/20 1902)  sterile water (preservative free) injection (  Given 10/06/20 1911)  sodium chloride 0.9 % bolus 1,000 mL (0 mLs Intravenous Stopped 10/06/20 2206)    ED Course  I have reviewed the triage vital signs and the nursing notes.  Pertinent labs & imaging results that were available during my care of the patient were reviewed by me and considered in my medical decision making (see chart for details).  Clinical Course as of Oct 08 27  Tue Oct 07, 2020  0019 Reassessed, still somewhat drowsy but improving - will sign out to [place]   [RD]    Clinical Course User Index [RD] Milagros Loll, MD   MDM Rules/Calculators/A&P                          34 year old lady presenting to ER due to severe agitation, abnormal behavior in the setting of polysubstance abuse.  On arrival  to ER patient was wildly agitated, thrashing in room, swinging at staff members.  Not redirectable, not demonstrating insight into her current condition, felt that she pose threat to herself and others and completed IVC paperwork.  Patient became calm after receiving Geodon and Ativan.  Her basic lab work was within normal limits.  EKG demonstrating sinus tachycardia.  Suspect most likely of her agitation, psychosis is related to polysubstance abuse.  While awaiting CT head, reassessment after medication and drugs have metabolized, signed out to Dr. Preston Fleeting.    Final Clinical Impression(s) / ED Diagnoses Final diagnoses:  Polysubstance abuse (HCC)  Agitation  Psychosis, unspecified psychosis type [facility])    Rx / DC Orders ED Discharge Orders    None       Milagros Loll, MD 10/07/20 [phone]

## 2020-10-07 ENCOUNTER — Emergency Department (HOSPITAL_COMMUNITY): Payer: Medicaid Other

## 2020-10-07 LAB — HCG, SERUM, QUALITATIVE: Preg, Serum: NEGATIVE

## 2020-10-07 MED ORDER — LORAZEPAM 2 MG/ML IJ SOLN
1.0000 mg | INTRAMUSCULAR | Status: DC | PRN
  Administered 2020-10-07 (×2): 1 mg via INTRAVENOUS
  Filled 2020-10-07 (×2): qty 1

## 2020-10-07 NOTE — ED Provider Notes (Signed)
I was informed by behavioral health office that patient did not have a commitment change release form signed.  Patient is still not suicidal and not psychotic.  Agree with Dr. Preston Fleeting that she is stable for discharge.   Pricilla Loveless, MD 10/07/20 [phone]

## 2020-10-07 NOTE — ED Notes (Signed)
Pt sts she is jumping around the bed and restless due to restless leg syndrome.

## 2020-10-07 NOTE — ED Provider Notes (Signed)
Care assumed from Dr. Stevie Kern, patient with agitation per following overdose, possibly related to amphetamine or cocaine.  She has been sedated and will need to be reassessed after sedation is worn off.  7:21 AM Patient reassessed.  She is somewhat tearful but cooperative and states that she does not have any desire to hurt her self or others.  She is felt to be safe for discharge.   Dione Booze, MD 10/07/20 [phone]

## 2020-10-07 NOTE — ED Notes (Signed)
Patient had several cups of ice water and graham cracker, was able to keep all of it down.  Will continue with patient's discharge at this time.

## 2020-12-31 ENCOUNTER — Emergency Department (HOSPITAL_COMMUNITY)
Admission: EM | Admit: 2020-12-31 | Discharge: 2020-12-31 | Disposition: A | Discharge status: Home / Self Care | Payer: Medicaid Other | Attending: Emergency Medicine | Admitting: Emergency Medicine

## 2020-12-31 ENCOUNTER — Encounter (HOSPITAL_COMMUNITY): Payer: Self-pay

## 2020-12-31 DIAGNOSIS — T50901A Poisoning by unspecified drugs, medicaments and biological substances, accidental (unintentional), initial encounter: Secondary | ICD-10-CM | POA: Insufficient documentation

## 2020-12-31 DIAGNOSIS — Y92481 Parking lot as the place of occurrence of the external cause: Secondary | ICD-10-CM | POA: Diagnosis not present

## 2020-12-31 DIAGNOSIS — R000 Tachycardia, unspecified: Secondary | ICD-10-CM | POA: Diagnosis not present

## 2020-12-31 DIAGNOSIS — X58XXXA Exposure to other specified factors, initial encounter: Secondary | ICD-10-CM | POA: Diagnosis not present

## 2020-12-31 DIAGNOSIS — F17210 Nicotine dependence, cigarettes, uncomplicated: Secondary | ICD-10-CM | POA: Insufficient documentation

## 2020-12-31 DIAGNOSIS — J4520 Mild intermittent asthma, uncomplicated: Secondary | ICD-10-CM | POA: Diagnosis not present

## 2020-12-31 LAB — COMPREHENSIVE METABOLIC PANEL
ALT: 17 U/L (ref 0–44)
AST: 19 U/L (ref 15–41)
Albumin: 5 g/dL (ref 3.5–5.0)
Alkaline Phosphatase: 57 U/L (ref 38–126)
Anion gap: 12 (ref 5–15)
BUN: 15 mg/dL (ref 6–20)
CO2: 21 mmol/L — ABNORMAL LOW (ref 22–32)
Calcium: 9.8 mg/dL (ref 8.9–10.3)
Chloride: 112 mmol/L — ABNORMAL HIGH (ref 98–111)
Creatinine, Ser: 0.75 mg/dL (ref 0.44–1.00)
GFR, Estimated: >60 mL/min (ref >60)
Glucose, Bld: 88 mg/dL (ref 70–99)
Potassium: 3.9 mmol/L (ref 3.5–5.1)
Sodium: 145 mmol/L (ref 135–145)
Total Bilirubin: 1.4 mg/dL — ABNORMAL HIGH (ref 0.3–1.2)
Total Protein: 8.3 g/dL — ABNORMAL HIGH (ref 6.5–8.1)

## 2020-12-31 LAB — CBC WITH DIFFERENTIAL/PLATELET
Abs Immature Granulocytes: 0.07 10*3/uL (ref 0.00–0.07)
Basophils Absolute: 0.1 10*3/uL (ref 0.0–0.1)
Basophils Relative: 1 %
Eosinophils Absolute: 0.2 10*3/uL (ref 0.0–0.5)
Eosinophils Relative: 2 %
HCT: 46.1 % — ABNORMAL HIGH (ref 36.0–46.0)
Hemoglobin: 15.7 g/dL — ABNORMAL HIGH (ref 12.0–15.0)
Immature Granulocytes: 1 %
Lymphocytes Relative: 27 %
Lymphs Abs: 3.8 10*3/uL (ref 0.7–4.0)
MCH: 30.8 pg (ref 26.0–34.0)
MCHC: 34.1 g/dL (ref 30.0–36.0)
MCV: 90.4 fL (ref 80.0–100.0)
Monocytes Absolute: 1.5 10*3/uL — ABNORMAL HIGH (ref 0.1–1.0)
Monocytes Relative: 11 %
Neutro Abs: 8.5 10*3/uL — ABNORMAL HIGH (ref 1.7–7.7)
Neutrophils Relative %: 58 %
Platelets: 355 10*3/uL (ref 150–400)
RBC: 5.1 MIL/uL (ref 3.87–5.11)
RDW: 13.6 % (ref 11.5–15.5)
WBC: 14.2 10*3/uL — ABNORMAL HIGH (ref 4.0–10.5)
nRBC: 0 % (ref 0.0–0.2)

## 2020-12-31 LAB — ETHANOL: Alcohol, Ethyl (B): <10 mg/dL (ref <10)

## 2020-12-31 MED ORDER — SODIUM CHLORIDE 0.9 % IV BOLUS
1000.0000 mL | Freq: Once | INTRAVENOUS | Status: AC
  Administered 2020-12-31: 1000 mL via INTRAVENOUS

## 2020-12-31 MED ORDER — NALOXONE HCL 4 MG/0.1ML NA LIQD: NASAL | 0 refills | Status: DC

## 2020-12-31 MED ORDER — ONDANSETRON HCL 4 MG/2ML IJ SOLN
4.0000 mg | Freq: Once | INTRAMUSCULAR | Status: AC
  Administered 2020-12-31: 4 mg via INTRAVENOUS
  Filled 2020-12-31: qty 2

## 2020-12-31 NOTE — ED Provider Notes (Signed)
[facility]   CSN: [id number] Arrival date & time: 12/31/20  1619     History Chief Complaint  Patient presents with  . Drug Overdose    Sandra Garza is a 35 y.o. female.  The history is provided by the patient, the EMS personnel and medical records. No language interpreter was used.     35 year old female significant history of opiate addiction, chronic pain syndrome, bipolar disorder brought here via EMS for drug overdose.  Per EMS, bystanders alert to patient erratic behaviors near the [employer].  When EMS arrived, patient was acting erratically, crying and laughing and was agitated.  Once patient was in the EMS bay, she did admits that she used meth as well as fentanyl.  She did not report trying to harm herself or anyone else.  Patient then had a brief episodes of unresponsiveness with pinpoint pupils requiring 1 mg of Narcan and she became responsive again.  An IV access was started and patient brought here.  At this time it is difficult to obtain history from patient as she is emotional.  She reports she is disappointing herself for making bad choices.  She does admits to recent drug use including meth and fentanyl.  Unsure any recent alcohol use.  She denies SI or HI or hallucination.  She reports she has no intent of harming herself.  She did report feeling that she needs help with her drug use.  She report pains all over.  Past Medical History:  Diagnosis Date  . ADHD   . Anxiety   . Asthma   . Bipolar 1 disorder, depressed (HCC)   . Drug abuse (HCC)   . Hypercholesteremia   . Pinched nerve in neck    C 6-7    Patient Active Problem List   Diagnosis Date Noted  . Opiate addiction (HCC) 05/30/2019  . Chronic pain syndrome 08/10/2018  . Numbness and tingling of right hand 12/28/2017  . Mild intermittent asthma without complication 12/28/2017  . Bipolar affective disorder (HCC) 12/28/2017  . Smoking trying to  quit 12/28/2017    Past Surgical History:  Procedure Laterality Date  . ADENOIDECTOMY    . CESAREAN SECTION    . CYSTECTOMY     on overy  . TONSILLECTOMY    . TUBAL LIGATION    . WISDOM TOOTH EXTRACTION       OB History    Gravida  3   Para  3   Term      Preterm      AB      Living        SAB      IAB      Ectopic      Multiple      Live Births              Family History  Problem Relation Age of Onset  . Heart disease Maternal Grandmother     Social History   Tobacco Use  . Smoking status: Current Every Day Smoker    Packs/day: 0.75    Types: Cigarettes    Start date: 12/26/2006  . Smokeless tobacco: Never Used  Vaping Use  . Vaping Use: Never used  Substance Use Topics  . Alcohol use: Not Currently  . Drug use: Yes    Types: Marijuana, Cocaine, Methamphetamines    Home Medications Prior to Admission medications   Medication Sig Start Date End Date Taking? Authorizing Provider  amitriptyline (  ELAVIL) 25 MG tablet Take 1 tablet (25 mg total) by mouth at bedtime. Patient not taking: Reported on 10/06/2020 07/13/18 10/07/20  Marcello Fennel, MD  FLUoxetine (PROZAC) 20 MG tablet Take 30 mg by mouth daily. Patient not taking: Reported on 10/06/2020  10/07/20  [provider]  gabapentin (NEURONTIN) 300 MG capsule Take 1 capsule (300 mg total) by mouth 3 (three) times daily. Patient not taking: Reported on 10/06/2020 05/29/19 10/07/20  Shirley, [place], DO  lamoTRIgine (LAMICTAL) 25 MG tablet Take 50 mg by mouth 2 (two) times daily. Patient not taking: Reported on 10/06/2020  10/07/20  [provider]  lisdexamfetamine (VYVANSE) 20 MG capsule Take 20 mg by mouth daily. Patient not taking: Reported on 10/06/2020  10/07/20  [provider]    Allergies    Ceclor [cefaclor]  Review of Systems   Review of Systems  Unable to perform ROS: Mental status change    Physical Exam Updated Vital Signs There were no  vitals taken for this visit.  Physical Exam Vitals and nursing note reviewed.  Constitutional:      Appearance: She is well-developed and well-nourished.     Comments: Patient is disheveled, crying, thrashing about the bed.  HENT:     Head: Normocephalic and atraumatic.     Mouth/Throat:     Mouth: Mucous membranes are moist.  Eyes:     Extraocular Movements: Extraocular movements intact.     Conjunctiva/sclera: Conjunctivae normal.     Pupils: Pupils are equal, round, and reactive to light.  Cardiovascular:     Rate and Rhythm: Tachycardia present.     Pulses: Normal pulses.     Heart sounds: Normal heart sounds.  Pulmonary:     Effort: Pulmonary effort is normal.     Breath sounds: Normal breath sounds.     Comments: No respiratory discomfort Abdominal:     Palpations: Abdomen is soft.     Tenderness: There is no abdominal tenderness.  Musculoskeletal:     Cervical back: Neck supple. No rigidity.     Comments: Able to move all 4 extremities  Skin:    Findings: No rash.  Neurological:     Mental Status: She is alert and oriented to person, place, and time.     GCS: GCS eye subscore is 4. GCS verbal subscore is 5. GCS motor subscore is 6.  Psychiatric:        Mood and Affect: Affect is labile and tearful.        Speech: Speech is slurred.        Behavior: Behavior is hyperactive.        Thought Content: Thought content does not include homicidal or suicidal ideation.     ED Results / Procedures / Treatments   Labs (all labs ordered are listed, but only abnormal results are displayed) Labs Reviewed - No data to display  EKG None  Radiology No results found.  Procedures Procedures   Medications Ordered in ED Medications - No data to display  ED Course  I have reviewed the triage vital signs and the nursing notes.  Pertinent labs & imaging results that were available during my care of the patient were reviewed by me and considered in my medical decision  making (see chart for details).    MDM Rules/Calculators/A&P                          BP (!) 129/93   Pulse  98   Temp 98.1 F (36.7 C) (Oral)   Resp 20   Ht 5\' 4"  (1.626 m)   Wt 63.5 kg   SpO2 95%   BMI 24.03 kg/m   Final Clinical Impression(s) / ED Diagnoses Final diagnoses:  Acute drug overdose, accidental or unintentional, initial encounter    Rx / DC Orders ED Discharge Orders         Ordered    naloxone (NARCAN) nasal spray 4 mg/0.1 mL        12/31/20 2011         4:42 PM Patient brought here for erratic behaviors.  Does have significant history of polysubstance abuse and admits to using meth and fentanyl earlier today.  Initially she was uncooperative and did receive midazolam by EMS.  She became more cooperative but subsequently had a brief bouts of unresponsiveness with pinpoint pupil.  EMS gave patient Narcan with improvement of symptoms.  At this time patient is responsive, tearful, crying, states that she went to detox 2 weeks ago and was clean for several days and voiced disappointment that she has relapsed and admits to using meth and fentanyl.  At this time patient is tachycardic, will provide supportive care, will monitor closely.  8:09 PM Patient now alert oriented, states that she will try her best to decrease her polysubstance use.  I strong encourage patient to seek help.  Will provide outpatient resources.  I will also provide prescription for Narcan and instruct patient to have it available and use in the event of concern for potential drug overdose.  Return precaution given.  Patient able to ambulate   2012, PA-C 12/31/20 2011    01/02/21, MD 01/04/21 [phone]

## 2020-12-31 NOTE — ED Triage Notes (Signed)
Pt BIB [facility] from a parking lot located near [place] known [place]. EMS was called by bystanders due to a woman running around an parking lot with bizarre/eratic behavior. Per EMS, pt was A&Ox1, only could tell them her first name. Friend endorsed that pt had been using meth and fentanyl for the past two days. Pt was noncompliant with EMS and PD. EMS gave 5mg  Midalazolam IM. Per EMS once in the truck, pt started snoring respirations and low O2 saturations. EMS gave 1mg  of Narcan. Since Narcan, pt is A&Ox4, can recall some events and is complaint with staff.

## 2021-01-02 ENCOUNTER — Emergency Department (HOSPITAL_COMMUNITY): Payer: Medicaid Other

## 2021-01-02 ENCOUNTER — Other Ambulatory Visit: Payer: Self-pay

## 2021-01-02 ENCOUNTER — Emergency Department (HOSPITAL_COMMUNITY)
Admission: EM | Admit: 2021-01-02 | Discharge: 2021-01-03 | Disposition: A | Discharge status: Other Acute Inpatient Hospital | Payer: Medicaid Other | Attending: Emergency Medicine | Admitting: Emergency Medicine

## 2021-01-02 ENCOUNTER — Encounter (HOSPITAL_COMMUNITY): Payer: Self-pay

## 2021-01-02 DIAGNOSIS — R079 Chest pain, unspecified: Secondary | ICD-10-CM | POA: Insufficient documentation

## 2021-01-02 DIAGNOSIS — F1110 Opioid abuse, uncomplicated: Secondary | ICD-10-CM | POA: Insufficient documentation

## 2021-01-02 DIAGNOSIS — F1520 Other stimulant dependence, uncomplicated: Secondary | ICD-10-CM | POA: Insufficient documentation

## 2021-01-02 DIAGNOSIS — F1910 Other psychoactive substance abuse, uncomplicated: Secondary | ICD-10-CM

## 2021-01-02 DIAGNOSIS — F17210 Nicotine dependence, cigarettes, uncomplicated: Secondary | ICD-10-CM | POA: Diagnosis not present

## 2021-01-02 DIAGNOSIS — Z20822 Contact with and (suspected) exposure to covid-19: Secondary | ICD-10-CM | POA: Insufficient documentation

## 2021-01-02 DIAGNOSIS — F3113 Bipolar disorder, current episode manic without psychotic features, severe: Secondary | ICD-10-CM | POA: Insufficient documentation

## 2021-01-02 DIAGNOSIS — J4520 Mild intermittent asthma, uncomplicated: Secondary | ICD-10-CM | POA: Insufficient documentation

## 2021-01-02 DIAGNOSIS — F1210 Cannabis abuse, uncomplicated: Secondary | ICD-10-CM | POA: Insufficient documentation

## 2021-01-02 LAB — CBC WITH DIFFERENTIAL/PLATELET
Abs Immature Granulocytes: 0.08 10*3/uL — ABNORMAL HIGH (ref 0.00–0.07)
Basophils Absolute: 0.1 10*3/uL (ref 0.0–0.1)
Basophils Relative: 1 %
Eosinophils Absolute: 0.5 10*3/uL (ref 0.0–0.5)
Eosinophils Relative: 3 %
HCT: 39.8 % (ref 36.0–46.0)
Hemoglobin: 13.8 g/dL (ref 12.0–15.0)
Immature Granulocytes: 1 %
Lymphocytes Relative: 31 %
Lymphs Abs: 4.8 10*3/uL — ABNORMAL HIGH (ref 0.7–4.0)
MCH: 31.4 pg (ref 26.0–34.0)
MCHC: 34.7 g/dL (ref 30.0–36.0)
MCV: 90.7 fL (ref 80.0–100.0)
Monocytes Absolute: 1.4 10*3/uL — ABNORMAL HIGH (ref 0.1–1.0)
Monocytes Relative: 9 %
Neutro Abs: 8.9 10*3/uL — ABNORMAL HIGH (ref 1.7–7.7)
Neutrophils Relative %: 55 %
Platelets: 319 10*3/uL (ref 150–400)
RBC: 4.39 MIL/uL (ref 3.87–5.11)
RDW: 13.6 % (ref 11.5–15.5)
WBC: 15.7 10*3/uL — ABNORMAL HIGH (ref 4.0–10.5)
nRBC: 0 % (ref 0.0–0.2)

## 2021-01-02 LAB — COMPREHENSIVE METABOLIC PANEL
ALT: 20 U/L (ref 0–44)
AST: 28 U/L (ref 15–41)
Albumin: 4.2 g/dL (ref 3.5–5.0)
Alkaline Phosphatase: 55 U/L (ref 38–126)
Anion gap: 8 (ref 5–15)
BUN: 14 mg/dL (ref 6–20)
CO2: 25 mmol/L (ref 22–32)
Calcium: 9 mg/dL (ref 8.9–10.3)
Chloride: 107 mmol/L (ref 98–111)
Creatinine, Ser: 0.47 mg/dL (ref 0.44–1.00)
GFR, Estimated: >60 mL/min (ref >60)
Glucose, Bld: 65 mg/dL — ABNORMAL LOW (ref 70–99)
Potassium: 3.5 mmol/L (ref 3.5–5.1)
Sodium: 140 mmol/L (ref 135–145)
Total Bilirubin: 1.1 mg/dL (ref 0.3–1.2)
Total Protein: 7.2 g/dL (ref 6.5–8.1)

## 2021-01-02 LAB — RAPID URINE DRUG SCREEN, HOSP PERFORMED
Amphetamines: POSITIVE — AB
Barbiturates: NOT DETECTED
Benzodiazepines: NOT DETECTED
Cocaine: NOT DETECTED
Opiates: POSITIVE — AB
Tetrahydrocannabinol: POSITIVE — AB

## 2021-01-02 LAB — SALICYLATE LEVEL: Salicylate Lvl: <7 mg/dL — ABNORMAL LOW (ref 7.0–30.0)

## 2021-01-02 LAB — RESP PANEL BY RT-PCR (FLU A&B, COVID) ARPGX2
Influenza A by PCR: NEGATIVE
Influenza B by PCR: NEGATIVE
SARS Coronavirus 2 by RT PCR: NEGATIVE

## 2021-01-02 LAB — I-STAT BETA HCG BLOOD, ED (MC, WL, AP ONLY): I-stat hCG, quantitative: <5 m[IU]/mL (ref <5)

## 2021-01-02 LAB — ETHANOL: Alcohol, Ethyl (B): <10 mg/dL (ref <10)

## 2021-01-02 LAB — CBG MONITORING, ED: Glucose-Capillary: 216 mg/dL — ABNORMAL HIGH (ref 70–99)

## 2021-01-02 LAB — ACETAMINOPHEN LEVEL: Acetaminophen (Tylenol), Serum: <10 ug/mL — ABNORMAL LOW (ref 10–30)

## 2021-01-02 LAB — TROPONIN I (HIGH SENSITIVITY): Troponin I (High Sensitivity): <2 ng/L (ref <18)

## 2021-01-02 MED ORDER — NICOTINE 21 MG/24HR TD PT24
21.0000 mg | MEDICATED_PATCH | Freq: Every day | TRANSDERMAL | Status: DC
  Administered 2021-01-02 – 2021-01-03 (×2): 21 mg via TRANSDERMAL
  Filled 2021-01-02 (×2): qty 1

## 2021-01-02 NOTE — ED Provider Notes (Signed)
[facility] Provider Note   CSN: [id number] Arrival date & time: 01/02/21  1507     History Chief Complaint  Patient presents with  . Addiction Problem    Sandra Garza is a 35 y.o. female.  35 yo F with a chief complaints of issues with addiction.  Patient was at [facility] after doing heroin and methamphetamines and was found in the bathroom talking to herself.  She was agitated with EMS and so was given Versed with some improvement but resultant bradypnea.  She was then given Narcan with improvement.  Patient states and some possible to live as a homeless person without a partner as a female and not be consistently intoxicated.  She feels that she is overwhelmed and somewhat frustrated.  Has tried to come clean multiple times.  On review of systems the patient does endorse some chest pain off and on.  She does not know when this seems to happen.  Seems to come and go.  Nothing seems to make it better or worse.  She has some trouble describing the symptoms.  The history is provided by the patient.  Illness Severity:  Moderate Onset quality:  Gradual Duration:  1 week Timing:  Constant Progression:  Worsening Chronicity:  New Associated symptoms: chest pain   Associated symptoms: no congestion, no fever, no headaches, no myalgias, no nausea, no rhinorrhea, no shortness of breath, no vomiting and no wheezing        Past Medical History:  Diagnosis Date  . ADHD   . Anxiety   . Asthma   . Bipolar 1 disorder, depressed (HCC)   . Drug abuse (HCC)   . Hypercholesteremia   . Pinched nerve in neck    C 6-7    Patient Active Problem List   Diagnosis Date Noted  . Opiate addiction (HCC) 05/30/2019  . Chronic pain syndrome 08/10/2018  . Numbness and tingling of right hand 12/28/2017  . Mild intermittent asthma without complication 12/28/2017  . Bipolar affective disorder (HCC) 12/28/2017  . Smoking trying to quit 12/28/2017    Past  Surgical History:  Procedure Laterality Date  . ADENOIDECTOMY    . CESAREAN SECTION    . CYSTECTOMY     on overy  . TONSILLECTOMY    . TUBAL LIGATION    . WISDOM TOOTH EXTRACTION       OB History    Gravida  3   Para  3   Term      Preterm      AB      Living        SAB      IAB      Ectopic      Multiple      Live Births              Family History  Problem Relation Age of Onset  . Heart disease Maternal Grandmother     Social History   Tobacco Use  . Smoking status: Current Every Day Smoker    Packs/day: 0.75    Types: Cigarettes    Start date: 12/26/2006  . Smokeless tobacco: Never Used  Vaping Use  . Vaping Use: Never used  Substance Use Topics  . Alcohol use: Not Currently  . Drug use: Yes    Types: Marijuana, Cocaine, Methamphetamines    Home Medications Prior to Admission medications   Medication Sig Start Date End Date Taking? Authorizing Provider  naloxone [facility]) nasal spray 4  mg/0.1 mL Follow instructions on drug packaging 12/31/20   Fayrene Helper, PA-C  amitriptyline (ELAVIL) 25 MG tablet Take 1 tablet (25 mg total) by mouth at bedtime. Patient not taking: Reported on 10/06/2020 07/13/18 10/07/20  Marcello Fennel, MD  FLUoxetine (PROZAC) 20 MG tablet Take 30 mg by mouth daily. Patient not taking: Reported on 10/06/2020  10/07/20  [provider]  gabapentin (NEURONTIN) 300 MG capsule Take 1 capsule (300 mg total) by mouth 3 (three) times daily. Patient not taking: Reported on 10/06/2020 05/29/19 10/07/20  Shirley, [place], DO  lamoTRIgine (LAMICTAL) 25 MG tablet Take 50 mg by mouth 2 (two) times daily. Patient not taking: Reported on 10/06/2020  10/07/20  [provider]  lisdexamfetamine (VYVANSE) 20 MG capsule Take 20 mg by mouth daily. Patient not taking: Reported on 10/06/2020  10/07/20  [provider]    Allergies    Ceclor [cefaclor]  Review of Systems   Review of Systems  Constitutional:  Negative for chills and fever.  HENT: Negative for congestion and rhinorrhea.   Eyes: Negative for redness and visual disturbance.  Respiratory: Negative for shortness of breath and wheezing.   Cardiovascular: Positive for chest pain. Negative for palpitations.  Gastrointestinal: Negative for nausea and vomiting.  Genitourinary: Negative for dysuria and urgency.  Musculoskeletal: Negative for arthralgias and myalgias.  Skin: Negative for pallor and wound.  Neurological: Negative for dizziness and headaches.    Physical Exam Updated Vital Signs BP 119/78 (BP Location: Left Arm)   Pulse 86   Temp 98 F (36.7 C) (Oral)   Resp (!) 22   Ht 5\' 4"  (1.626 m)   Wt 63.5 kg   SpO2 98%   BMI 24.03 kg/m   Physical Exam Vitals and nursing note reviewed.  Constitutional:      General: She is not in acute distress.    Appearance: She is well-developed and well-nourished. She is not diaphoretic.  HENT:     Head: Normocephalic and atraumatic.  Eyes:     Extraocular Movements: EOM normal.     Pupils: Pupils are equal, round, and reactive to light.  Cardiovascular:     Rate and Rhythm: Normal rate and regular rhythm.     Heart sounds: No murmur heard. No friction rub. No gallop.   Pulmonary:     Effort: Pulmonary effort is normal.     Breath sounds: No wheezing or rales.  Abdominal:     General: There is no distension.     Palpations: Abdomen is soft.     Tenderness: There is no abdominal tenderness.  Musculoskeletal:        General: No tenderness or edema.     Cervical back: Normal range of motion and neck supple.  Skin:    General: Skin is warm and dry.  Neurological:     Mental Status: She is alert and oriented to person, place, and time.  Psychiatric:        Mood and Affect: Mood is depressed.        Behavior: Behavior is agitated.     ED Results / Procedures / Treatments   Labs (all labs ordered are listed, but only abnormal results are displayed) Labs Reviewed   COMPREHENSIVE METABOLIC PANEL - Abnormal; Notable for the following components:      Result Value   Glucose, Bld 65 (*)    All other components within normal limits  RAPID URINE DRUG SCREEN, HOSP PERFORMED - Abnormal; Notable for the following components:  Opiates POSITIVE (*)    Amphetamines POSITIVE (*)    Tetrahydrocannabinol POSITIVE (*)    All other components within normal limits  CBC WITH DIFFERENTIAL/PLATELET - Abnormal; Notable for the following components:   WBC 15.7 (*)    Neutro Abs 8.9 (*)    Lymphs Abs 4.8 (*)    Monocytes Absolute 1.4 (*)    Abs Immature Granulocytes 0.08 (*)    All other components within normal limits  SALICYLATE LEVEL - Abnormal; Notable for the following components:   Salicylate Lvl <7.0 (*)    All other components within normal limits  ACETAMINOPHEN LEVEL - Abnormal; Notable for the following components:   Acetaminophen (Tylenol), Serum <10 (*)    All other components within normal limits  CBG MONITORING, ED - Abnormal; Notable for the following components:   Glucose-Capillary 216 (*)    All other components within normal limits  RESP PANEL BY RT-PCR (FLU A&B, COVID) ARPGX2  ETHANOL  I-STAT BETA HCG BLOOD, ED ([facility], [facility], [facility] ONLY)  TROPONIN I (HIGH SENSITIVITY)    EKG EKG Interpretation  Date/Time:  Friday January 02 2021 15:34:13 EST Ventricular Rate:  91 PR Interval:  152 QRS Duration: 94 QT Interval:  352 QTC Calculation: 432 R Axis:   149 Text Interpretation: Normal sinus rhythm Left posterior fascicular block Inferior infarct , age undetermined Abnormal ECG No significant change since last tracing Confirmed by Melene Plan [phone]) on 01/02/2021 3:43:13 PM   Radiology DG Chest Port 1 View  Result Date: 01/02/2021 CLINICAL DATA:  Chest pain.  History of asthma. EXAM: PORTABLE CHEST 1 VIEW COMPARISON:  Prior chest radiographs 11/05/2018. FINDINGS: Heart size within normal limits. No appreciable airspace consolidation or edema. No  evidence of pleural effusion or pneumothorax. No acute bony abnormality identified. IMPRESSION: No evidence of active cardiopulmonary disease. Electronically Signed   By: Jackey Loge DO   On: 01/02/2021 15:56    Procedures Procedures   Medications Ordered in ED Medications  nicotine (NICODERM CQ - dosed in mg/24 hours) patch 21 mg (21 mg Transdermal Patch Applied 01/02/21 1653)    ED Course  I have reviewed the triage vital signs and the nursing notes.  Pertinent labs & imaging results that were available during my care of the patient were reviewed by me and considered in my medical decision making (see chart for details).    MDM Rules/Calculators/A&P                          35 yo F with a chief complaints of difficulty with illegal drugs.  The patient has been using methamphetamines and opiates.  Found agitated in the bathroom at [facility] and required sedation in route and then narcotic reversal.  She endorses worthlessness and feeling of not being able to fix her current scenario.  No overt SI or HI.  We will have TTS evaluate.  Patient's blood work has resulted, negative Tylenol and salicylate levels.  Patient's blood sugar is mildly low at 65.  We will recheck as she is already had something to eat and drink here.  I feel she is medically clear.  TTS evaluation.  The patients results and plan were reviewed and discussed.   Any x-rays performed were independently reviewed by myself.   Differential diagnosis were considered with the presenting HPI.  Medications  nicotine (NICODERM CQ - dosed in mg/24 hours) patch 21 mg (21 mg Transdermal Patch Applied 01/02/21 1653)    Vitals:  01/02/21 1521 01/02/21 1529 01/02/21 1530 01/02/21 1751  BP: 126/89   119/78  Pulse: (!) 55   86  Resp: (!) 22   (!) 22  Temp: 98 F (36.7 C)     TempSrc: Oral     SpO2: 99% 99% 99% 98%  Weight:   63.5 kg   Height:   5\' 4"  (1.626 m)     Final diagnoses:  Chest pain  Polysubstance abuse  [facility](HCC)       Final Clinical Impression(s) / ED Diagnoses Final diagnoses:  Chest pain  Polysubstance abuse [facility](HCC)    Rx / DC Orders ED Discharge Orders    None       Melene PlanFloyd, [place] Jaquith, DO 01/02/21 2213

## 2021-01-02 NOTE — ED Notes (Signed)
Pt changed out into burgundy scrubs.. vital checked. pt belongings located in locker 29

## 2021-01-02 NOTE — ED Notes (Signed)
Patient very manic, asking for water and food, getting up and pacing around room, moving around in bed.  Patient given food and water, asked to remain in bed for her safety and call if she needs anything.
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
## 2021-01-02 NOTE — ED Notes (Signed)
Dinner tray given

## 2021-01-02 NOTE — ED Triage Notes (Addendum)
Patient brought in by [facility] EMS from [place] where patient was "acting erratically".  Patient endorses use of meth, fentanyl, and heroin and desire to "get clean and stop making so many bad choices".  Patient denies SI or HI.  Patient placed in room directly opposite nurses station for close observation

## 2021-01-02 NOTE — ED Notes (Signed)
Pt given sandwich, crackers and juice, states she will give urine sample when shes able to go!

## 2021-01-03 ENCOUNTER — Encounter (HOSPITAL_COMMUNITY): Payer: Self-pay | Admitting: Student

## 2021-01-03 ENCOUNTER — Other Ambulatory Visit: Payer: Self-pay

## 2021-01-03 ENCOUNTER — Inpatient Hospital Stay (HOSPITAL_COMMUNITY)
Admission: AD | Admit: 2021-01-03 | Discharge: 2021-01-12 | DRG: 885 | Disposition: A | Discharge status: Home / Self Care | Payer: Medicaid Other | Source: Intra-hospital | Attending: Behavioral Health | Admitting: Behavioral Health

## 2021-01-03 DIAGNOSIS — F3130 Bipolar disorder, current episode depressed, mild or moderate severity, unspecified: Principal | ICD-10-CM | POA: Diagnosis present

## 2021-01-03 DIAGNOSIS — G4709 Other insomnia: Secondary | ICD-10-CM | POA: Diagnosis present

## 2021-01-03 DIAGNOSIS — F1123 Opioid dependence with withdrawal: Secondary | ICD-10-CM | POA: Diagnosis present

## 2021-01-03 DIAGNOSIS — F909 Attention-deficit hyperactivity disorder, unspecified type: Secondary | ICD-10-CM | POA: Diagnosis present

## 2021-01-03 DIAGNOSIS — J45909 Unspecified asthma, uncomplicated: Secondary | ICD-10-CM | POA: Diagnosis present

## 2021-01-03 DIAGNOSIS — F1210 Cannabis abuse, uncomplicated: Secondary | ICD-10-CM | POA: Diagnosis not present

## 2021-01-03 DIAGNOSIS — F319 Bipolar disorder, unspecified: Secondary | ICD-10-CM | POA: Diagnosis not present

## 2021-01-03 DIAGNOSIS — F1120 Opioid dependence, uncomplicated: Secondary | ICD-10-CM

## 2021-01-03 DIAGNOSIS — Z881 Allergy status to other antibiotic agents status: Secondary | ICD-10-CM | POA: Diagnosis not present

## 2021-01-03 DIAGNOSIS — Z5902 Unsheltered homelessness: Secondary | ICD-10-CM | POA: Diagnosis not present

## 2021-01-03 DIAGNOSIS — F17210 Nicotine dependence, cigarettes, uncomplicated: Secondary | ICD-10-CM | POA: Diagnosis present

## 2021-01-03 DIAGNOSIS — G894 Chronic pain syndrome: Secondary | ICD-10-CM | POA: Diagnosis present

## 2021-01-03 DIAGNOSIS — E7800 Pure hypercholesterolemia, unspecified: Secondary | ICD-10-CM | POA: Diagnosis present

## 2021-01-03 DIAGNOSIS — F419 Anxiety disorder, unspecified: Secondary | ICD-10-CM | POA: Diagnosis present

## 2021-01-03 DIAGNOSIS — F1520 Other stimulant dependence, uncomplicated: Secondary | ICD-10-CM

## 2021-01-03 MED ORDER — LORAZEPAM 1 MG PO TABS
1.0000 mg | ORAL_TABLET | Freq: Once | ORAL | Status: AC
  Administered 2021-01-03: 1 mg via ORAL
  Filled 2021-01-03: qty 1

## 2021-01-03 MED ORDER — FLUOXETINE HCL 20 MG PO CAPS
20.0000 mg | ORAL_CAPSULE | Freq: Every day | ORAL | Status: DC
  Administered 2021-01-03: 20 mg via ORAL
  Filled 2021-01-03: qty 1

## 2021-01-03 MED ORDER — MAGNESIUM HYDROXIDE 400 MG/5ML PO SUSP: 30.0000 mL | Freq: Every day | ORAL | Status: DC | PRN

## 2021-01-03 MED ORDER — ALUM & MAG HYDROXIDE-SIMETH 200-200-20 MG/5ML PO SUSP: 30.0000 mL | ORAL | Status: DC | PRN

## 2021-01-03 MED ORDER — NAPROXEN 500 MG PO TABS
500.0000 mg | ORAL_TABLET | Freq: Two times a day (BID) | ORAL | Status: AC | PRN
  Administered 2021-01-03 – 2021-01-05 (×3): 500 mg via ORAL
  Filled 2021-01-03 (×3): qty 1

## 2021-01-03 MED ORDER — METHOCARBAMOL 500 MG PO TABS
500.0000 mg | ORAL_TABLET | Freq: Three times a day (TID) | ORAL | Status: AC | PRN
  Administered 2021-01-03 – 2021-01-05 (×3): 500 mg via ORAL
  Filled 2021-01-03 (×3): qty 1

## 2021-01-03 MED ORDER — CLONIDINE HCL 0.1 MG PO TABS
0.1000 mg | ORAL_TABLET | ORAL | Status: AC
  Administered 2021-01-06 – 2021-01-08 (×4): 0.1 mg via ORAL
  Filled 2021-01-03 (×6): qty 1

## 2021-01-03 MED ORDER — NICOTINE 21 MG/24HR TD PT24
21.0000 mg | MEDICATED_PATCH | Freq: Every day | TRANSDERMAL | Status: DC
  Administered 2021-01-04 – 2021-01-12 (×9): 21 mg via TRANSDERMAL
  Filled 2021-01-03 (×14): qty 1

## 2021-01-03 MED ORDER — CLONIDINE HCL 0.1 MG PO TABS
0.1000 mg | ORAL_TABLET | Freq: Four times a day (QID) | ORAL | Status: AC
  Administered 2021-01-03 – 2021-01-06 (×10): 0.1 mg via ORAL
  Filled 2021-01-03 (×11): qty 1

## 2021-01-03 MED ORDER — CLONIDINE HCL 0.1 MG PO TABS
0.1000 mg | ORAL_TABLET | Freq: Every day | ORAL | Status: AC
  Administered 2021-01-09 – 2021-01-10 (×2): 0.1 mg via ORAL
  Filled 2021-01-03 (×3): qty 1

## 2021-01-03 MED ORDER — GABAPENTIN 100 MG PO CAPS
200.0000 mg | ORAL_CAPSULE | Freq: Two times a day (BID) | ORAL | Status: DC
  Administered 2021-01-03: 200 mg via ORAL
  Filled 2021-01-03: qty 2

## 2021-01-03 MED ORDER — HYDROXYZINE HCL 25 MG PO TABS
25.0000 mg | ORAL_TABLET | Freq: Three times a day (TID) | ORAL | Status: DC | PRN
  Administered 2021-01-03 – 2021-01-11 (×17): 25 mg via ORAL
  Filled 2021-01-03 (×18): qty 1

## 2021-01-03 MED ORDER — ACETAMINOPHEN 325 MG PO TABS
650.0000 mg | ORAL_TABLET | Freq: Four times a day (QID) | ORAL | Status: DC | PRN
  Administered 2021-01-05: 650 mg via ORAL
  Filled 2021-01-03: qty 2

## 2021-01-03 MED ORDER — DICYCLOMINE HCL 20 MG PO TABS
20.0000 mg | ORAL_TABLET | Freq: Four times a day (QID) | ORAL | Status: AC | PRN
  Administered 2021-01-03 – 2021-01-06 (×4): 20 mg via ORAL
  Filled 2021-01-03 (×4): qty 1

## 2021-01-03 MED ORDER — ONDANSETRON 4 MG PO TBDP
4.0000 mg | ORAL_TABLET | Freq: Four times a day (QID) | ORAL | Status: AC | PRN
  Administered 2021-01-03 – 2021-01-06 (×4): 4 mg via ORAL
  Filled 2021-01-03 (×4): qty 1

## 2021-01-03 MED ORDER — TRAZODONE HCL 50 MG PO TABS
50.0000 mg | ORAL_TABLET | Freq: Every evening | ORAL | Status: DC | PRN
  Administered 2021-01-03 – 2021-01-06 (×4): 50 mg via ORAL
  Filled 2021-01-03 (×5): qty 1

## 2021-01-03 MED ORDER — LOPERAMIDE HCL 2 MG PO CAPS
2.0000 mg | ORAL_CAPSULE | ORAL | Status: AC | PRN
  Administered 2021-01-06: 2 mg via ORAL
  Administered 2021-01-06: 4 mg via ORAL
  Filled 2021-01-03: qty 1
  Filled 2021-01-03: qty 2

## 2021-01-03 NOTE — Progress Notes (Signed)
Signed & Held Admission Orders placed for [facility].

## 2021-01-03 NOTE — [facility] Suicide Risk Assessment (Signed)
[facility] Admission Suicide Risk Assessment   Nursing information obtained from:  Patient Demographic factors:  Caucasian,Low socioeconomic status,Living alone,Unemployed Current Mental Status:  Self-harm behaviors Loss Factors:  Legal issues,Financial problems / change in socioeconomic status Historical Factors:  Prior suicide attempts,Impulsivity Risk Reduction Factors:  Responsible for children under 37 years of age,Sense of responsibility to family  Total Time spent with patient: 15 minutes Principal Problem: Bipolar 1 disorder (HCC) Diagnosis:  Principal Problem:   Bipolar 1 disorder (HCC) Active Problems:   Opioid use disorder, severe, dependence (HCC)   Methamphetamine use disorder, severe, dependence (HCC)  Subjective Data: Patient is a 35 year old female with a past psychiatric history significant for opiate addiction, chronic pain syndrome and reported bipolar disorder who presented to the [facility] emergency department after a drug overdose.  She was brought to [facility] by EMS.  It was noted that the patient was having erratic behaviors.  When EMS arrived the patient was noted to be acting erratically, crying, laughing and agitated.  She admitted to EMS that she had used methamphetamines as well as fentanyl.  She had brief periods of unresponsiveness, and required Narcan.  She was taken to the hospital for evaluation.  During the comprehensive clinical assessment the patient was curled up and rocking back and forth.  She admitted that she had been prescribed psychiatric medicines from [facility], but stopped using them when she was using substances.  She is currently homeless, has no family or friends.  She denied suicidal ideation.  She did overdose in the past on Wellbutrin at age 40.  She was admitted to the hospital for evaluation and stabilization.  She currently denies suicidal ideation, and denies any withdrawal syndromes.  Continued  Clinical Symptoms:  Alcohol Use Disorder Identification Test Final Score (AUDIT): 0 The "Alcohol Use Disorders Identification Test", Guidelines for Use in Primary Care, Second Edition.  World Science writer [facility]). Score between 0-7:  no or low risk or alcohol related problems. Score between 8-15:  moderate risk of alcohol related problems. Score between 16-19:  high risk of alcohol related problems. Score 20 or above:  warrants further diagnostic evaluation for alcohol dependence and treatment.   CLINICAL FACTORS:   Bipolar Disorder:   Mixed State Alcohol/Substance Abuse/Dependencies   Musculoskeletal: Strength & Muscle Tone: within normal limits Gait & Station: normal Patient leans: N/A  Psychiatric Specialty Exam: Physical Exam Vitals and nursing note reviewed.  HENT:     Head: Normocephalic and atraumatic.  Pulmonary:     Effort: Pulmonary effort is normal.  Neurological:     General: No focal deficit present.     Mental Status: She is alert and oriented to person, place, and time.     Review of Systems  Blood pressure 125/85, pulse (!) 113, temperature 99.1 F (37.3 C), temperature source Oral, resp. rate 18, height 5\' 4"  (1.626 m), weight 59 kg, last menstrual period 01/03/2021, SpO2 100 %.Body mass index is 22.31 kg/m.  General Appearance: Disheveled  Eye Contact:  Fair  Speech:  Normal Rate  Volume:  Decreased  Mood:  Depressed and Dysphoric  Affect:  Congruent  Thought Process:  Coherent and Descriptions of Associations: Intact  Orientation:  Full (Time, Place, and Person)  Thought Content:  Logical  Suicidal Thoughts:  No  Homicidal Thoughts:  No  Memory:  Immediate;   Fair Recent;   Fair Remote;   Fair  Judgement:  Intact  Insight:  Fair  Psychomotor Activity:  Normal  Concentration:  Concentration: Fair and Attention Span: Fair  Recall:  [employer] of Knowledge:  Fair  Language:  Good  Akathisia:  Negative  Handed:  Right  AIMS (if indicated):      Assets:  Desire for Improvement Resilience  ADL's:  Intact  Cognition:  WNL  Sleep:         COGNITIVE FEATURES THAT CONTRIBUTE TO RISK:  None    SUICIDE RISK:   Mild:  Suicidal ideation of limited frequency, intensity, duration, and specificity.  There are no identifiable plans, no associated intent, mild dysphoria and related symptoms, good self-control (both objective and subjective assessment), few other risk factors, and identifiable protective factors, including available and accessible social support.  PLAN OF CARE: Patient is a 35 year old female with the above-stated past psychiatric history who was admitted to the hospital after an unintentional overdose of substances.  She will be admitted to the hospital.  She will be integrated in the milieu.  She will be encouraged to attend groups.  We will restart her medications that she had received in [facility].  We will monitor for withdrawal syndromes.  She denied any withdrawal symptoms currently.  Review of her laboratories revealed a blood sugar of 216, but otherwise normal electrolytes including creatinine and liver function enzymes.  Her white blood cell count is mildly elevated at 15.7, but the rest of her CBC was normal.  Absolute neutrophil count was mildly elevated at 8.9.  Acetaminophen was less than 10, salicylate less than 7.  Her beta-hCG was negative.  Respiratory panel for influenza A, B and coronavirus were all negative.  Blood alcohol was less than 10.  Drug screen was positive for amphetamines, opiates and marijuana.  Chest x-ray is negative.  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  Vital signs are stable, she is afebrile.  I certify that inpatient services furnished can reasonably be expected to improve the patient's condition.   Antonieta Pert, MD 01/03/2021, 1:23 PM

## 2021-01-03 NOTE — Progress Notes (Signed)
Patient admitted to [facility] hall from [facility] voluntarily. Patient A & O x 4. She reported that she relapsed on drugs (meth and heroin). She reported that she was sober for two weeks prior to relapsing. She stated that she was at [facility] last month for treatment and then she left to go to [facility] for residential treatment. She reported that she's homeless and she cannot return back to her mother's house because her mother has custody of her three children, ages 67, 56 & 91 y.o. She reported that  she's unable to live in the home with the children. She reported that she lives in a tent and that the streets are not good for her and she keeps using drugs. She reported that she overdosed on heroin on Wednesday and was released from the hospital. She reported that she was locked up in jail on Sunday and released on Monday for theft. She reported that she has an upcoming court date at the end of March. She denies having any withdrawal symptoms at this time. During the skin assessment, she was acting erratic, fidgeting around, crying, yelling "Let me leave" and hiding in the corner. Writer was able to provide verbal support and the pt eventually calmed down. Writer explained the admission process and unit expectations to the pt and she was then escorted onto the unit. Since she's been on the unit her behaviors have been appropriate and she's been resting. During a follow up check, she continued to deny having any withdrawal symptoms. She denies SI/HI. She denies AVH. 15 minute checks performed for safety.

## 2021-01-03 NOTE — Tx Team (Signed)
Initial Treatment Plan 01/03/2021 11:00 AM Sandra Garza YOV:[id number]    PATIENT STRESSORS: Financial difficulties Legal issue Loss of Custody of children  Substance abuse   PATIENT STRENGTHS: Ability for insight Motivation for treatment/growth   PATIENT IDENTIFIED PROBLEMS: "relapsed on drugs (heroin and meth)"  "homeless"                    DISCHARGE CRITERIA:  Ability to meet basic life and health needs Adequate post-discharge living arrangements Improved stabilization in mood, thinking, and/or behavior  PRELIMINARY DISCHARGE PLAN: Attend aftercare/continuing care group Attend PHP/IOP  PATIENT/FAMILY INVOLVEMENT: This treatment plan has been presented to and reviewed with the patient, Sandra Garza, and/or family member.  The patient and family have been given the opportunity to ask questions and make suggestions.  Layla Barter, RN 01/03/2021, 11:00 AM

## 2021-01-03 NOTE — H&P (Signed)
Psychiatric Admission Assessment Adult  Patient Identification: Sandra Garza  MRN:  [id number]  Date of Evaluation:  01/03/2021  Chief Complaint:  Bipolar 1 disorder (HCC) [F31.9]  Principal Diagnosis: Bipolar 1 disorder (HCC)  Diagnosis:  Principal Problem:   Bipolar 1 disorder (HCC) Active Problems:   Opioid use disorder, severe, dependence (HCC)   Methamphetamine use disorder, severe, dependence (HCC)  History of Present Illness: This is the first psychiatric admission assessment in this [facility] for this 35 year old Caucasian female with hx of mental health issues, opioid use disorder & methamphetamine. She is admitted to the [facility] from the [facility] ED with need for possible opioid detoxification & mood stabilization treatments. Chart review reports indicated that patient was brought to the [facility] ED by the EMS with chief complaints of erratic behaviors in public. And while at the ED, she was noted to be agitated, fidgety, manic, pacing around & talking to herself. Patient apparently has issues with heroin & methamphetamine addictions. She is on disability based on her mental health issues (Bipolar disorder). Sandra Garza is a mother of 3 children who are being raised by her mother. Her UDS was positive for: Amphetamine, Opioid & THC. During this admission assessments, Sandra Garza reports, "The ambulance brought me to the [facility] yesterday. The police called EMS for me. The staff at the [employer] called the cops. I was at [facility] using their bathroom. While in the bathroom, I was talking to myself. I was aware of what I was doing, just talking to myself about what has been going on with my life. I have no where to go or live. I have been homeless for 2 years due to my drug addictions. I'm on disability for my mental health issues. I can only afford my drugs to feed my habit, but unable to afford a place to stay the same time. I have been on drugs  for 2 years. Heroin & Methamphetamine are my drugs of choice. I was snorting them. I last used heroin & Meth on Thursday, 2 days ago. I'm not having any withdrawal symptoms as of yet. I was sober for 2 weeks prior to using 2 days ago. I relapsed because of my homelessness. It is hard to live on the streets. The streets are rough. I was diagnosed with Bipolar disorder at age 60. I was taking medications, but not very complaint when using drugs. I don't have an outpatient psychiatric provider. I was admitted to the [facility] from February 2nd, 2022 thru the 9th. I was discharged on Prozac, Lamictal & gabapentin. I'm not suicidal, homicidal, hearing voices or seeing things. But, I do have mood swings up to 5 times a week, unprovoked. I sleep very poorly at night. My depression at this time is #8 & anxiety #8. I started using drugs at age 77, introduced to drugs by my boyfriend".  Associated Signs/Symptoms:  Depression Symptoms:  depressed mood, insomnia, feelings of worthlessness/guilt, difficulty concentrating, hopelessness, anxiety,  Duration of Depression Symptoms: Greater than two weeks  (Hypo) Manic Symptoms:  Impulsivity, Irritable Mood, Labiality of Mood,  Anxiety Symptoms:  Excessive Worry,  Psychotic Symptoms:  Currently denies any hallucinations, delusional thinking or paranoia.  Duration of Psychotic Symptoms: No data recorded  PTSD Symptoms: Denies any PTSD symptoms or event. NA  Total Time spent with patient: 1 hour  Past Psychiatric History: Bipolar disorder, Methamphetamine & opioid use disorders.  Is the patient at risk to self? No.  Has the patient been a risk to self in the past 6 months? No.  Has the patient been a risk to self within the distant past? No.  Is the patient a risk to others? No.  Has the patient been a risk to others in the past 6 months? No.  Has the patient been a risk to others within the distant past? No.   Prior Inpatient  Therapy: Yes, [facility]. Prior Outpatient Therapy: None reported.  Alcohol Screening:   Substance Abuse History in the last 12 months:  Yes.    Consequences of Substance Abuse: Discussed witg patient during this admission evaluation. Medical Consequences:  Liver damage, Possible death by overdose Legal Consequences:  Arrests, jail time, Loss of driving privilege. Family Consequences:  Family discord, divorce and or separation.  Previous Psychotropic Medications: Yes, "Lamictal, Prozac"  Psychological Evaluations: No   Past Medical History:  Past Medical History:  Diagnosis Date  . ADHD   . Anxiety   . Asthma   . Bipolar 1 disorder, depressed (HCC)   . Drug abuse (HCC)   . Hypercholesteremia   . Pinched nerve in neck    C 6-7    Past Surgical History:  Procedure Laterality Date  . ADENOIDECTOMY    . CESAREAN SECTION    . CYSTECTOMY     on overy  . TONSILLECTOMY    . TUBAL LIGATION    . WISDOM TOOTH EXTRACTION     Family History:  Family History  Problem Relation Age of Onset  . Heart disease Maternal Grandmother    Family Psychiatric  History: Denies any family hx of mental illness or substance use disorder.  Tobacco Screening: Smokes a pack of cigarettes daily.   Social History:  Social History   Substance and Sexual Activity  Alcohol Use Not Currently     Social History   Substance and Sexual Activity  Drug Use Yes  . Types: Marijuana, Cocaine, Methamphetamines    Additional Social History:  Allergies:   Allergies  Allergen Reactions  . Ceclor [Cefaclor] Hives   Lab Results:  Results for orders placed or performed during the hospital encounter of 01/02/21 (from the past 48 hour(s))  Resp Panel by RT-PCR (Flu A&B, Covid) Nasopharyngeal Swab     Status: None   Collection Time: 01/02/21  4:00 PM   Specimen: Nasopharyngeal Swab; Nasopharyngeal(NP) swabs in vial transport medium  Result Value Ref Range   SARS Coronavirus 2 by RT  PCR NEGATIVE NEGATIVE    Comment: (NOTE) SARS-CoV-2 target nucleic acids are NOT DETECTED.  The SARS-CoV-2 RNA is generally detectable in upper respiratory specimens during the acute phase of infection. The lowest concentration of SARS-CoV-2 viral copies this assay can detect is 138 copies/mL. A negative result does not preclude SARS-Cov-2 infection and should not be used as the sole basis for treatment or other patient management decisions. A negative result may occur with  improper specimen collection/handling, submission of specimen other than nasopharyngeal swab, presence of viral mutation(s) within the areas targeted by this assay, and inadequate number of viral copies(<138 copies/mL). A negative result must be combined with clinical observations, patient history, and epidemiological information. The expected result is Negative.  Fact Sheet for Patients:  BloggerCourse.comhttps://www.fda.gov/media/152166/download  Fact Sheet for Healthcare Providers:  SeriousBroker.ithttps://www.fda.gov/media/152162/download  This test is no t yet approved or cleared by the [place] FDA and  has been authorized for detection and/or diagnosis of SARS-CoV-2 by FDA under an Emergency Use Authorization (EUA). This  EUA will remain  in effect (meaning this test can be used) for the duration of the COVID-19 declaration under Section 564(b)(1) of the Act, 21 U.S.C.section 360bbb-3(b)(1), unless the authorization is terminated  or revoked sooner.       Influenza A by PCR NEGATIVE NEGATIVE   Influenza B by PCR NEGATIVE NEGATIVE    Comment: (NOTE) The Xpert Xpress SARS-CoV-2/FLU/RSV plus assay is intended as an aid in the diagnosis of influenza from Nasopharyngeal swab specimens and should not be used as a sole basis for treatment. Nasal washings and aspirates are unacceptable for Xpert Xpress SARS-CoV-2/FLU/RSV testing.  Fact Sheet for Patients: BloggerCourse.com  Fact Sheet for Healthcare  Providers: SeriousBroker.it  This test is not yet approved or cleared by the [place] FDA and has been authorized for detection and/or diagnosis of SARS-CoV-2 by FDA under an Emergency Use Authorization (EUA). This EUA will remain in effect (meaning this test can be used) for the duration of the COVID-19 declaration under Section 564(b)(1) of the Act, 21 U.S.C. section 360bbb-3(b)(1), unless the authorization is terminated or revoked.  Performed at [facility], [address], [place], [place] [address]   Comprehensive metabolic panel     Status: Abnormal   Collection Time: 01/02/21  4:00 PM  Result Value Ref Range   Sodium 140 135 - 145 mmol/L   Potassium 3.5 3.5 - 5.1 mmol/L   Chloride 107 98 - 111 mmol/L   CO2 25 22 - 32 mmol/L   Glucose, Bld 65 (L) 70 - 99 mg/dL    Comment: Glucose reference range applies only to samples taken after fasting for at least 8 hours.   BUN 14 6 - 20 mg/dL   Creatinine, Ser 9.56 0.44 - 1.00 mg/dL   Calcium 9.0 8.9 - 21.3 mg/dL   Total Protein 7.2 6.5 - 8.1 g/dL   Albumin 4.2 3.5 - 5.0 g/dL   AST 28 15 - 41 U/L   ALT 20 0 - 44 U/L   Alkaline Phosphatase 55 38 - 126 U/L   Total Bilirubin 1.1 0.3 - 1.2 mg/dL   GFR, Estimated >08 >65 mL/min    Comment: (NOTE) Calculated using the CKD-EPI Creatinine Equation (2021)    Anion gap 8 5 - 15    Comment: Performed at [facility], [address], [place], [place] [address]  Ethanol     Status: None   Collection Time: 01/02/21  4:00 PM  Result Value Ref Range   Alcohol, Ethyl (B) <10 <10 mg/dL    Comment: (NOTE) Lowest detectable limit for serum alcohol is 10 mg/dL.  For medical purposes only. Performed at [facility], [address] [place], [place] [address]   CBC with Diff     Status: Abnormal   Collection Time: 01/02/21  4:00 PM  Result Value Ref Range   WBC 15.7 (H) 4.0 - 10.5 K/uL   RBC 4.39 3.87 -  5.11 MIL/uL   Hemoglobin 13.8 12.0 - 15.0 g/dL   HCT 84.1 32.4 - 40.1 %   MCV 90.7 80.0 - 100.0 fL   MCH 31.4 26.0 - 34.0 pg   MCHC 34.7 30.0 - 36.0 g/dL   RDW 02.7 25.3 - 66.4 %   Platelets 319 150 - 400 K/uL   nRBC 0.0 0.0 - 0.2 %   Neutrophils Relative % 55 %   Neutro Abs 8.9 (H) 1.7 - 7.7 K/uL   Lymphocytes Relative 31 %   Lymphs Abs  4.8 (H) 0.7 - 4.0 K/uL   Monocytes Relative 9 %   Monocytes Absolute 1.4 (H) 0.1 - 1.0 K/uL   Eosinophils Relative 3 %   Eosinophils Absolute 0.5 0.0 - 0.5 K/uL   Basophils Relative 1 %   Basophils Absolute 0.1 0.0 - 0.1 K/uL   Immature Granulocytes 1 %   Abs Immature Granulocytes 0.08 (H) 0.00 - 0.07 K/uL    Comment: Performed at [facility], [address] [place], [place] [address]  Salicylate level     Status: Abnormal   Collection Time: 01/02/21  4:00 PM  Result Value Ref Range   Salicylate Lvl <7.0 (L) 7.0 - 30.0 mg/dL    Comment: Performed at [facility], [address] [place], [place] [address]  Acetaminophen level     Status: Abnormal   Collection Time: 01/02/21  4:00 PM  Result Value Ref Range   Acetaminophen (Tylenol), Serum <10 (L) 10 - 30 ug/mL    Comment: (NOTE) Therapeutic concentrations vary significantly. A range of 10-30 ug/mL  may be an effective concentration for many patients. However, some  are best treated at concentrations outside of this range. Acetaminophen concentrations >150 ug/mL at 4 hours after ingestion  and >50 ug/mL at 12 hours after ingestion are often associated with  toxic reactions.  Performed at [facility], [address], [place], [place] [address]   Troponin I (High Sensitivity)     Status: None   Collection Time: 01/02/21  4:00 PM  Result Value Ref Range   Troponin I (High Sensitivity) <2 <18 ng/L    Comment: (NOTE) Elevated high sensitivity troponin I (hsTnI) values and significant  changes across serial measurements may suggest  ACS but many other  chronic and acute conditions are known to elevate hsTnI results.  Refer to the "Links" section for chest pain algorithms and additional  guidance. Performed at [facility], [address], [place], [place] [address]   I-Stat beta hCG blood, ED     Status: None   Collection Time: 01/02/21  4:24 PM  Result Value Ref Range   I-stat hCG, quantitative <5.0 <5 mIU/mL   Comment 3            Comment:   GEST. AGE      CONC.  (mIU/mL)   <=1 WEEK        5 - 50     2 WEEKS       50 - 500     3 WEEKS       100 - 10,000     4 WEEKS     1,000 - 30,000        FEMALE AND NON-PREGNANT FEMALE:     LESS THAN 5 mIU/mL   Urine rapid drug screen (hosp performed)     Status: Abnormal   Collection Time: 01/02/21  4:46 PM  Result Value Ref Range   Opiates POSITIVE (A) NONE DETECTED   Cocaine NONE DETECTED NONE DETECTED   Benzodiazepines NONE DETECTED NONE DETECTED   Amphetamines POSITIVE (A) NONE DETECTED   Tetrahydrocannabinol POSITIVE (A) NONE DETECTED   Barbiturates NONE DETECTED NONE DETECTED    Comment: (NOTE) DRUG SCREEN FOR MEDICAL PURPOSES ONLY.  IF CONFIRMATION IS NEEDED FOR ANY PURPOSE, NOTIFY LAB WITHIN 5 DAYS.  LOWEST DETECTABLE LIMITS FOR URINE DRUG SCREEN Drug Class  Cutoff (ng/mL) Amphetamine and metabolites    1000 Barbiturate and metabolites    200 Benzodiazepine                 200 Tricyclics and metabolites     300 Opiates and metabolites        300 Cocaine and metabolites        300 THC                            50 Performed at [facility], [address] [place], [place] [address]   CBG monitoring, ED     Status: Abnormal   Collection Time: 01/02/21  5:37 PM  Result Value Ref Range   Glucose-Capillary 216 (H) 70 - 99 mg/dL    Comment: Glucose reference range applies only to samples taken after fasting for at least 8 hours.   Blood Alcohol level:  Lab Results  Component Value Date   ETH  <10 01/02/2021   ETH <10 12/31/2020   Metabolic Disorder Labs:  No results found for: HGBA1C, MPG No results found for: PROLACTIN No results found for: CHOL, TRIG, HDL, CHOLHDL, VLDL, LDLCALC  Current Medications: Current Facility-Administered Medications  Medication Dose Route Frequency Provider Last Rate Last Admin  . acetaminophen (TYLENOL) tablet 650 mg  650 mg Oral Q6H PRN Antonieta Pert, MD      . alum & mag hydroxide-simeth (MAALOX/MYLANTA) 200-200-20 MG/5ML suspension 30 mL  30 mL Oral Q4H PRN Antonieta Pert, MD      . hydrOXYzine (ATARAX/VISTARIL) tablet 25 mg  25 mg Oral TID PRN Antonieta Pert, MD      . magnesium hydroxide (MILK OF MAGNESIA) suspension 30 mL  30 mL Oral Daily PRN Antonieta Pert, MD      . nicotine (NICODERM CQ - dosed in mg/24 hours) patch 21 mg  21 mg Transdermal Daily Antonieta Pert, MD      . traZODone (DESYREL) tablet 50 mg  50 mg Oral QHS PRN Antonieta Pert, MD       PTA Medications: Medications Prior to Admission  Medication Sig Dispense Refill Last Dose  . naloxone (NARCAN) nasal spray 4 mg/0.1 mL Follow instructions on drug packaging 1 each 0    Musculoskeletal: Strength & Muscle Tone: within normal limits Gait & Station: normal Patient leans: N/A  Psychiatric Specialty Exam: Physical Exam Vitals and nursing note reviewed.  Constitutional:      Comments: Appears thin (emaciated).  HENT:     Head: Normocephalic.     Nose: Nose normal.     Mouth/Throat:     Pharynx: Oropharynx is clear.  Eyes:     Pupils: Pupils are equal, round, and reactive to light.  Cardiovascular:     Rate and Rhythm: Normal rate.     Comments: Elevated pulse rate: 113.  Patient is currently in apparent distress. Pulmonary:     Effort: Pulmonary effort is normal.  Genitourinary:    Comments: Deferred Musculoskeletal:        General: Normal range of motion.     Cervical back: Normal range of motion.  Skin:    General: Skin is warm and  dry.  Neurological:     General: No focal deficit present.     Mental Status: She is alert and oriented to person, place, and time.     Review of Systems  Constitutional: Negative for chills, diaphoresis and fever.  HENT: Negative for congestion, rhinorrhea, sneezing and sore throat.   Eyes: Negative for discharge.  Respiratory: Negative for cough, shortness of breath and wheezing.   Cardiovascular: Negative for chest pain (Hx of while at the ED) and palpitations (Elevated pulse rate: 113).  Gastrointestinal: Negative for diarrhea, nausea and vomiting.  Endocrine: Negative for cold intolerance.  Genitourinary: Negative for difficulty urinating.  Musculoskeletal: Negative for arthralgias and myalgias.  Skin: Negative.   Allergic/Immunologic: Negative for environmental allergies and food allergies.       Allergies: Ceclor  Neurological: Negative for dizziness, tremors, seizures, syncope, facial asymmetry, speech difficulty, weakness, light-headedness, numbness and headaches.  Psychiatric/Behavioral: Positive for agitation, decreased concentration, dysphoric mood and sleep disturbance. Negative for behavioral problems, confusion, hallucinations and suicidal ideas. The patient is nervous/anxious and is hyperactive.     Blood pressure 125/85, pulse (!) 113, temperature 99.1 F (37.3 C), temperature source Oral, resp. rate 18, height 5\' 4"  (1.626 m), weight 59 kg, last menstrual period 01/03/2021, SpO2 100 %.Body mass index is 22.31 kg/m.  General Appearance: Disheveled, thin frame  Eye Contact:  Fair  Speech:  Clear and Coherent and Pressured  Volume:  Increased  Mood:  Anxious, Dysphoric and Irritable  Affect:  Congruent, Depressed and Flat  Thought Process:  Coherent, Goal Directed and Descriptions of Associations: Tangential  Orientation:  Full (Time, Place, and Person)  Thought Content:  Rumination, denies any hallucinations, delusions or paranoia.  Suicidal Thoughts:  Currently  denies any thoughts, plans or intent. Denies any hx of attempt.  Homicidal Thoughts:  Denies  Memory:  Immediate;   Fair Recent;   Fair Remote;   Fair  Judgement:  Impaired  Insight:  Fair  Psychomotor Activity:  Restlessness and Fidgety  Concentration:  Concentration: Poor  Recall:  01/05/2021 of Knowledge:  Fair  Language:  Fair  Akathisia:  Negative  Handed:  Right  AIMS (if indicated):     Assets:  Communication Skills Desire for Improvement Resilience  ADL's:  Intact  Cognition:  WNL  Sleep: New admit.   Treatment Plan Summary: Daily contact with patient to assess and evaluate symptoms and progress in treatment and Medication management.  Treatment Plan/Recommendations: 1. Admit for crisis management and stabilization, estimated length of stay 3-5 days.  2. Medication management to reduce current symptoms to base line and improve the patient's overall level of functioning: See [facility] for plan of care. 3. Treat health problems as indicated.  4. Develop treatment plan to decrease risk of relapse upon discharge and the need for readmission.  5. Psycho-social education regarding relapse prevention and self care.  6. Health care follow up as needed for medical problems.  7. Review, reconcile, and reinstate any pertinent home medications for other health issues where appropriate. 8. Call for consults with hospitalist for any additional specialty patient care services as needed.  Observation Level/Precautions:  15 minute checks  Laboratory:  Per ED, current lab reports reviewed. We will obtain: HGBa1c, Lipid panel, TSH, Lamictal levels.  Psychotherapy: Group session  Medications: See MAR  Consultations:  As needed  Discharge Concerns: Safety, mood stability, maintaining sobriety.  Estimated LOS: 2-4 days  Other: Admit to the 300-hall.    Physician Treatment Plan for Primary Diagnosis: Bipolar 1 disorder (HCC)  Long Term Goal(s): Improvement in symptoms so as ready for  discharge  Short Term Goals: Ability to identify changes in lifestyle to reduce recurrence of condition will improve, Ability to verbalize feelings will improve, Ability to disclose and  discuss suicidal ideas and Ability to demonstrate self-control will improve  Physician Treatment Plan for Secondary Diagnosis: Principal Problem:   Bipolar 1 disorder (HCC) Active Problems:   Opioid use disorder, severe, dependence (HCC)   Methamphetamine use disorder, severe, dependence (HCC)  Long Term Goal(s): Improvement in symptoms so as ready for discharge  Short Term Goals: Ability to identify and develop effective coping behaviors will improve, Compliance with prescribed medications will improve and Ability to identify triggers associated with substance abuse/mental health issues will improve  I certify that inpatient services furnished can reasonably be expected to improve the patient's condition.    Armandina Stammer, NP, PMHNP, FNP-BC 2/26/202211:41 AM

## 2021-01-03 NOTE — ED Notes (Signed)
Called safe transport to transport pt to [facility]. Representative said he will call when he is outside [facility]. Gave pt clothes and boots from clothes closet.

## 2021-01-04 LAB — LIPID PANEL
Cholesterol: 142 mg/dL (ref 0–200)
HDL: 42 mg/dL (ref >40)
LDL Cholesterol: 71 mg/dL (ref 0–99)
Total CHOL/HDL Ratio: 3.4 RATIO
Triglycerides: 146 mg/dL (ref <150)
VLDL: 29 mg/dL (ref 0–40)

## 2021-01-04 LAB — TSH: TSH: 0.229 u[IU]/mL — ABNORMAL LOW (ref 0.350–4.500)

## 2021-01-04 NOTE — [facility] Group Notes (Signed)
Psychoeducational Group Note    Date:01/04/2021 Time: 1300-1400    Life Skills:  A group where two lists are made. What people need and what are things that we do that are healthy. The lists are developed by the patients and it is explained that we often do the actions that are not healthy to get our list of needs met.   Purpose of Group: . The group focus' on teaching patients on how to identify their needs and how to develop the coping skills needed to get their needs met  Participation Level:  Did not attend  Sandra Garza

## 2021-01-04 NOTE — Progress Notes (Signed)
[facility] MD Progress Note  01/04/2021 9:37 AM Sandra Garza  MRN:  [id number]  Subjective: Sandra Garza reports, "I feel like crap. I'm hot, I'm cold. I'm restless".  Objective: This is the first psychiatric admission assessment in this [facility] for this 35 year old Caucasian female with hx of mental health issues, opioid use disorder & methamphetamine. She is admitted to the [facility] from the [facility] ED with need for possible opioid detoxification & mood stabilization treatments. Chart review reports indicated that patient was brought to the [facility] ED by the EMS with chief complaints of erratic behaviors in public. And while at the ED, she was noted to be agitated, fidgety, manic, pacing around & talking to herself. Patient apparently has issues with heroin & methamphetamine addictions. She is on disability based on her mental health issues (Bipolar disorder). Sandra Garza is a mother of 3 children who are being raised by her mother. Her UDS was positive for: Amphetamine, Opioid & THC. Daily notes: Sandra Garza is seen, chart reviewed. The chart findings discussed with the treatment team. She is lying down in her bed restless, but was eating an apple. She says she feels like crap. She is complaining of hot/cold flashes. She is already started on the COWS detox protocols for opioid withdrawal management. She is yet to attend group sessions. She is currently lying down in her bed fidgety & appears uncomfortable. She denies any SIHI, AVH, delusions or paranoia. Support & encouragement provided.  Principal Problem: Bipolar 1 disorder (HCC)  Diagnosis: Principal Problem:   Bipolar 1 disorder (HCC) Active Problems:   Opioid use disorder, severe, dependence (HCC)   Methamphetamine use disorder, severe, dependence (HCC)  Total Time spent with patient: 25 minutes  Past Psychiatric History: See H&P.  Past Medical History:  Past Medical History:  Diagnosis Date  . ADHD   . Anxiety   . Asthma   . Bipolar  1 disorder, depressed (HCC)   . Drug abuse (HCC)   . Hypercholesteremia   . Pinched nerve in neck    C 6-7    Past Surgical History:  Procedure Laterality Date  . ADENOIDECTOMY    . CESAREAN SECTION    . CYSTECTOMY     on overy  . TONSILLECTOMY    . TUBAL LIGATION    . WISDOM TOOTH EXTRACTION     Family History:  Family History  Problem Relation Age of Onset  . Heart disease Maternal Grandmother    Family Psychiatric  History: See H&P  Social History:  Social History   Substance and Sexual Activity  Alcohol Use Not Currently     Social History   Substance and Sexual Activity  Drug Use Yes  . Types: Marijuana, Cocaine, Methamphetamines    Social History   Socioeconomic History  . Marital status: Single    Spouse name: Not on file  . Number of children: Not on file  . Years of education: Not on file  . Highest education level: Not on file  Occupational History  . Not on file  Tobacco Use  . Smoking status: Current Every Day Smoker    Packs/day: 0.75    Types: Cigarettes    Start date: 12/26/2006  . Smokeless tobacco: Never Used  Vaping Use  . Vaping Use: Never used  Substance and Sexual Activity  . Alcohol use: Not Currently  . Drug use: Yes    Types: Marijuana, Cocaine, Methamphetamines  . Sexual activity: Yes    Birth control/protection: Surgical  Other Topics Concern  . Not on file  Social History Narrative  . Not on file   Social Determinants of Health   Financial Resource Strain: Not on file  Food Insecurity: Not on file  Transportation Needs: Not on file  Physical Activity: Not on file  Stress: Not on file  Social Connections: Not on file   Additional Social History:   Sleep: Fair  Appetite:  Fair  Current Medications: Current Facility-Administered Medications  Medication Dose Route Frequency Provider Last Rate Last Admin  . acetaminophen (TYLENOL) tablet 650 mg  650 mg Oral Q6H PRN Antonieta Pert, MD      . alum & mag  hydroxide-simeth (MAALOX/MYLANTA) 200-200-20 MG/5ML suspension 30 mL  30 mL Oral Q4H PRN Antonieta Pert, MD      . cloNIDine (CATAPRES) tablet 0.1 mg  0.1 mg Oral QID Melbourne Abts W, PA-C   0.1 mg at 01/04/21 0914   Followed by  . [START ON 01/06/2021] cloNIDine (CATAPRES) tablet 0.1 mg  0.1 mg Oral BH-qamhs Jaclyn Shaggy, PA-C       Followed by  . [START ON 01/09/2021] cloNIDine (CATAPRES) tablet 0.1 mg  0.1 mg Oral QAC breakfast Melbourne Abts W, PA-C      . dicyclomine (BENTYL) tablet 20 mg  20 mg Oral Q6H PRN Melbourne Abts W, PA-C   20 mg at 01/04/21 8938  . hydrOXYzine (ATARAX/VISTARIL) tablet 25 mg  25 mg Oral TID PRN Antonieta Pert, MD   25 mg at 01/04/21 0914  . loperamide (IMODIUM) capsule 2-4 mg  2-4 mg Oral PRN Melbourne Abts W, PA-C      . magnesium hydroxide (MILK OF MAGNESIA) suspension 30 mL  30 mL Oral Daily PRN Antonieta Pert, MD      . methocarbamol (ROBAXIN) tablet 500 mg  500 mg Oral Q8H PRN Jaclyn Shaggy, PA-C   500 mg at 01/03/21 2140  . naproxen (NAPROSYN) tablet 500 mg  500 mg Oral BID PRN Jaclyn Shaggy, PA-C   500 mg at 01/03/21 2140  . nicotine (NICODERM CQ - dosed in mg/24 hours) patch 21 mg  21 mg Transdermal Daily Antonieta Pert, MD   21 mg at 01/04/21 0914  . ondansetron (ZOFRAN-ODT) disintegrating tablet 4 mg  4 mg Oral Q6H PRN Jaclyn Shaggy, PA-C   4 mg at 01/03/21 2140  . traZODone (DESYREL) tablet 50 mg  50 mg Oral QHS PRN Antonieta Pert, MD   50 mg at 01/03/21 2114   Lab Results:  Results for orders placed or performed during the hospital encounter of 01/02/21 (from the past 48 hour(s))  Resp Panel by RT-PCR (Flu A&B, Covid) Nasopharyngeal Swab     Status: None   Collection Time: 01/02/21  4:00 PM   Specimen: Nasopharyngeal Swab; Nasopharyngeal(NP) swabs in vial transport medium  Result Value Ref Range   SARS Coronavirus 2 by RT PCR NEGATIVE NEGATIVE    Comment: (NOTE) SARS-CoV-2 target nucleic acids are NOT DETECTED.  The SARS-CoV-2 RNA is  generally detectable in upper respiratory specimens during the acute phase of infection. The lowest concentration of SARS-CoV-2 viral copies this assay can detect is 138 copies/mL. A negative result does not preclude SARS-Cov-2 infection and should not be used as the sole basis for treatment or other patient management decisions. A negative result may occur with  improper specimen collection/handling, submission of specimen other than nasopharyngeal swab, presence of viral mutation(s) within the areas targeted by this  assay, and inadequate number of viral copies(<138 copies/mL). A negative result must be combined with clinical observations, patient history, and epidemiological information. The expected result is Negative.  Fact Sheet for Patients:  BloggerCourse.com  Fact Sheet for Healthcare Providers:  SeriousBroker.it  This test is no t yet approved or cleared by the [place] FDA and  has been authorized for detection and/or diagnosis of SARS-CoV-2 by FDA under an Emergency Use Authorization (EUA). This EUA will remain  in effect (meaning this test can be used) for the duration of the COVID-19 declaration under Section 564(b)(1) of the Act, 21 U.S.C.section 360bbb-3(b)(1), unless the authorization is terminated  or revoked sooner.       Influenza A by PCR NEGATIVE NEGATIVE   Influenza B by PCR NEGATIVE NEGATIVE    Comment: (NOTE) The Xpert Xpress SARS-CoV-2/FLU/RSV plus assay is intended as an aid in the diagnosis of influenza from Nasopharyngeal swab specimens and should not be used as a sole basis for treatment. Nasal washings and aspirates are unacceptable for Xpert Xpress SARS-CoV-2/FLU/RSV testing.  Fact Sheet for Patients: BloggerCourse.com  Fact Sheet for Healthcare Providers: SeriousBroker.it  This test is not yet approved or cleared by the [place] FDA  and has been authorized for detection and/or diagnosis of SARS-CoV-2 by FDA under an Emergency Use Authorization (EUA). This EUA will remain in effect (meaning this test can be used) for the duration of the COVID-19 declaration under Section 564(b)(1) of the Act, 21 U.S.C. section 360bbb-3(b)(1), unless the authorization is terminated or revoked.  Performed at [facility], [address], [place], [place] [address]   Comprehensive metabolic panel     Status: Abnormal   Collection Time: 01/02/21  4:00 PM  Result Value Ref Range   Sodium 140 135 - 145 mmol/L   Potassium 3.5 3.5 - 5.1 mmol/L   Chloride 107 98 - 111 mmol/L   CO2 25 22 - 32 mmol/L   Glucose, Bld 65 (L) 70 - 99 mg/dL    Comment: Glucose reference range applies only to samples taken after fasting for at least 8 hours.   BUN 14 6 - 20 mg/dL   Creatinine, Ser 2.13 0.44 - 1.00 mg/dL   Calcium 9.0 8.9 - 08.6 mg/dL   Total Protein 7.2 6.5 - 8.1 g/dL   Albumin 4.2 3.5 - 5.0 g/dL   AST 28 15 - 41 U/L   ALT 20 0 - 44 U/L   Alkaline Phosphatase 55 38 - 126 U/L   Total Bilirubin 1.1 0.3 - 1.2 mg/dL   GFR, Estimated >57 >84 mL/min    Comment: (NOTE) Calculated using the CKD-EPI Creatinine Equation (2021)    Anion gap 8 5 - 15    Comment: Performed at [facility], [address] [place], [place] [address]  Ethanol     Status: None   Collection Time: 01/02/21  4:00 PM  Result Value Ref Range   Alcohol, Ethyl (B) <10 <10 mg/dL    Comment: (NOTE) Lowest detectable limit for serum alcohol is 10 mg/dL.  For medical purposes only. Performed at [facility], [address], [place], [place] [address]   CBC with Diff     Status: Abnormal   Collection Time: 01/02/21  4:00 PM  Result Value Ref Range   WBC 15.7 (H) 4.0 - 10.5 K/uL   RBC 4.39 3.87 - 5.11 MIL/uL   Hemoglobin 13.8 12.0 - 15.0 g/dL   HCT 32.4 40.1 - 02.7 %  MCV 90.7 80.0 - 100.0 fL   MCH 31.4 26.0 -  34.0 pg   MCHC 34.7 30.0 - 36.0 g/dL   RDW 65.7 84.6 - 96.2 %   Platelets 319 150 - 400 K/uL   nRBC 0.0 0.0 - 0.2 %   Neutrophils Relative % 55 %   Neutro Abs 8.9 (H) 1.7 - 7.7 K/uL   Lymphocytes Relative 31 %   Lymphs Abs 4.8 (H) 0.7 - 4.0 K/uL   Monocytes Relative 9 %   Monocytes Absolute 1.4 (H) 0.1 - 1.0 K/uL   Eosinophils Relative 3 %   Eosinophils Absolute 0.5 0.0 - 0.5 K/uL   Basophils Relative 1 %   Basophils Absolute 0.1 0.0 - 0.1 K/uL   Immature Granulocytes 1 %   Abs Immature Granulocytes 0.08 (H) 0.00 - 0.07 K/uL    Comment: Performed at [facility], [address], [place], [place] [address]  Salicylate level     Status: Abnormal   Collection Time: 01/02/21  4:00 PM  Result Value Ref Range   Salicylate Lvl <7.0 (L) 7.0 - 30.0 mg/dL    Comment: Performed at [facility], [address] [place], [place] [address]  Acetaminophen level     Status: Abnormal   Collection Time: 01/02/21  4:00 PM  Result Value Ref Range   Acetaminophen (Tylenol), Serum <10 (L) 10 - 30 ug/mL    Comment: (NOTE) Therapeutic concentrations vary significantly. A range of 10-30 ug/mL  may be an effective concentration for many patients. However, some  are best treated at concentrations outside of this range. Acetaminophen concentrations >150 ug/mL at 4 hours after ingestion  and >50 ug/mL at 12 hours after ingestion are often associated with  toxic reactions.  Performed at [facility], [address], [place], [place] [address]   Troponin I (High Sensitivity)     Status: None   Collection Time: 01/02/21  4:00 PM  Result Value Ref Range   Troponin I (High Sensitivity) <2 <18 ng/L    Comment: (NOTE) Elevated high sensitivity troponin I (hsTnI) values and significant  changes across serial measurements may suggest ACS but many other  chronic and acute conditions are known to elevate hsTnI results.  Refer to the "Links" section for  chest pain algorithms and additional  guidance. Performed at [facility], [address], [place], [place] [address]   I-Stat beta hCG blood, ED     Status: None   Collection Time: 01/02/21  4:24 PM  Result Value Ref Range   I-stat hCG, quantitative <5.0 <5 mIU/mL   Comment 3            Comment:   GEST. AGE      CONC.  (mIU/mL)   <=1 WEEK        5 - 50     2 WEEKS       50 - 500     3 WEEKS       100 - 10,000     4 WEEKS     1,000 - 30,000        FEMALE AND NON-PREGNANT FEMALE:     LESS THAN 5 mIU/mL   Urine rapid drug screen (hosp performed)     Status: Abnormal   Collection Time: 01/02/21  4:46 PM  Result Value Ref Range   Opiates POSITIVE (A) NONE DETECTED   Cocaine NONE DETECTED NONE DETECTED   Benzodiazepines NONE DETECTED NONE DETECTED  Amphetamines POSITIVE (A) NONE DETECTED   Tetrahydrocannabinol POSITIVE (A) NONE DETECTED   Barbiturates NONE DETECTED NONE DETECTED    Comment: (NOTE) DRUG SCREEN FOR MEDICAL PURPOSES ONLY.  IF CONFIRMATION IS NEEDED FOR ANY PURPOSE, NOTIFY LAB WITHIN 5 DAYS.  LOWEST DETECTABLE LIMITS FOR URINE DRUG SCREEN Drug Class                     Cutoff (ng/mL) Amphetamine and metabolites    1000 Barbiturate and metabolites    200 Benzodiazepine                 200 Tricyclics and metabolites     300 Opiates and metabolites        300 Cocaine and metabolites        300 THC                            50 Performed at [facility], [address] [place], [place] [id number]   CBG monitoring, ED     Status: Abnormal   Collection Time: 01/02/21  5:37 PM  Result Value Ref Range   Glucose-Capillary 216 (H) 70 - 99 mg/dL    Comment: Glucose reference range applies only to samples taken after fasting for at least 8 hours.    Blood Alcohol level:  Lab Results  Component Value Date   ETH <10 01/02/2021   ETH <10 12/31/2020   Metabolic Disorder Labs: No results found for: HGBA1C, MPG No results found  for: PROLACTIN No results found for: CHOL, TRIG, HDL, CHOLHDL, VLDL, LDLCALC  Physical Findings: AIMS: Facial and Oral Movements Muscles of Facial Expression: None, normal Lips and Perioral Area: None, normal Jaw: None, normal Tongue: None, normal,Extremity Movements Upper (arms, wrists, hands, fingers): None, normal Lower (legs, knees, ankles, toes): None, normal, Trunk Movements Neck, shoulders, hips: None, normal, Overall Severity Severity of abnormal movements (highest score from questions above): None, normal Incapacitation due to abnormal movements: None, normal Patient's awareness of abnormal movements (rate only patient's report): No Awareness, Dental Status Current problems with teeth and/or dentures?: No Does patient usually wear dentures?: No  CIWA:  CIWA-Ar Total: 4 COWS:  COWS Total Score: 8  Musculoskeletal: Strength & Muscle Tone: within normal limits Gait & Station: normal Patient leans: N/A  Psychiatric Specialty Exam: Physical Exam Vitals and nursing note reviewed.  HENT:     Head: Normocephalic.     Nose: Nose normal.     Mouth/Throat:     Pharynx: Oropharynx is clear.  Eyes:     Pupils: Pupils are equal, round, and reactive to light.  Cardiovascular:     Pulses: Normal pulses.  Pulmonary:     Effort: Pulmonary effort is normal.  Genitourinary:    Comments: Deferred Musculoskeletal:        General: Normal range of motion.     Cervical back: Normal range of motion.  Skin:    General: Skin is warm and dry.  Neurological:     General: No focal deficit present.     Mental Status: She is alert and oriented to person, place, and time.     Review of Systems  Constitutional: Negative for chills, diaphoresis and fever.  HENT: Negative for congestion, rhinorrhea, sneezing and sore throat.   Eyes: Negative for discharge.  Respiratory: Negative for cough, shortness of breath and wheezing.   Cardiovascular: Negative for chest pain and palpitations.   Gastrointestinal: Negative for  diarrhea, nausea and vomiting.  Endocrine: Negative for cold intolerance.  Genitourinary: Negative for difficulty urinating.  Musculoskeletal: Negative for arthralgias and myalgias.  Skin: Negative for color change.  Allergic/Immunologic: Negative for environmental allergies and food allergies.       Allergies: Ceclor  Neurological: Negative for dizziness, tremors, seizures, syncope, facial asymmetry, speech difficulty, weakness, light-headedness, numbness and headaches.  Psychiatric/Behavioral: Positive for dysphoric mood. Negative for agitation, behavioral problems, confusion, decreased concentration, self-injury, sleep disturbance and suicidal ideas. The patient is not nervous/anxious and is not hyperactive.     Blood pressure (!) 143/122, pulse 94, temperature 99.1 F (37.3 C), temperature source Oral, resp. rate 18, height 5\' 4"  (1.626 m), weight 59 kg, last menstrual period 01/03/2021, SpO2 100 %.Body mass index is 22.31 kg/m.  General Appearance: Disheveled  Eye Contact:  Minimal  Speech:  Clear and Coherent and Slow  Volume:  Decreased  Mood:  Anxious, Dysphoric and Irritable  Affect:  Congruent and Flat  Thought Process:  Coherent and Descriptions of Associations: Intact  Orientation:  Full (Time, Place, and Person)  Thought Content:  Logical  Suicidal Thoughts:  Denies  Homicidal Thoughts:  Denies  Memory:  Immediate;   Fair Recent;   Fair Remote;   Fair  Judgement:  Fair  Insight:  Fair  Psychomotor Activity:  Restlessness  Concentration:  Concentration: Poor and Attention Span: Poor  Recall:  01/05/2021 of Knowledge:  Fair  Language:  Good  Akathisia:  Negative  Handed:  Right  AIMS (if indicated):     Assets:  Desire for Improvement Resilience  ADL's:  Intact  Cognition:  WNL  Sleep:  Number of Hours: 6.75   Treatment Plan Summary: Daily contact with patient to assess and evaluate symptoms and progress in treatment and  Medication management.  Continue inpatient hospitalization. Will continue today 01/04/2021 plan as below except where it is noted.  Opioid withdrawal Management. Continue the COWS detox protocols.  Nicotine withdrawal management. Continue the Nicotine patch 21 mg topically Q 24 hrs.  Insomnia. Continue Trazodone 50 mg po Q bedtime po prn.  Other prns. Continue Tylenol 650 mg po Q 6 hrs prn for pain/fever. Continue Mylanta 30 ml po Q 4 hrs prn for indigestion. Continue MOM 30 ml po prn daily for constipation.  Encourage group participation. Discharge disposition plan in progress.  01/06/2021, NP, PMHNP, FNP-BC 01/04/2021, 9:37 AM

## 2021-01-04 NOTE — Progress Notes (Signed)
   01/04/21 2318  Psych Admission Type (Psych Patients Only)  Admission Status Voluntary  Psychosocial Assessment  Patient Complaints Substance abuse  Eye Contact Fair  Facial Expression Anxious  Affect Appropriate to circumstance  Speech Logical/coherent  Interaction Assertive  Motor Activity Fidgety  Appearance/Hygiene Disheveled  Behavior Characteristics Appropriate to situation  Mood Anxious;Pleasant  Thought Process  Coherency WDL  Content Blaming self  Delusions None reported or observed  Perception WDL  Hallucination None reported or observed  Judgment Impaired  Confusion None  Danger to Self  Current suicidal ideation? Denies  Danger to Others  Danger to Others None reported or observed

## 2021-01-04 NOTE — [facility] Group Notes (Signed)
Adult Psychoeducational Group Not Date:  01/04/2021 Time:  0900-1045 Group Topic/Focus: PROGRESSIVE RELAXATION. A group where deep breathing is taught and tensing and relaxation muscle groups is used. Imagery is used as well.  Pts are asked to imagine 3 pillars that hold them up when they are not able to hold themselves up.  Participation Level:  Did not attend   Judge, Christine A    

## 2021-01-04 NOTE — [facility] Group Notes (Signed)
[facility] LCSW Group Therapy Note  Date/Time:  01/04/2021 9:00-10:00 or 10:00-11:00AM  Type of Therapy and Topic:  Group Therapy:  Healthy and Unhealthy Supports  Participation Level:  Did Not Attend   Description of Group:  Patients in this group were introduced to the idea of adding a variety of healthy supports to address the various needs in their lives.Patients discussed what additional healthy supports could be helpful in their recovery and wellness after discharge in order to prevent future hospitalizations.   An emphasis was placed on using counselor, doctor, therapy groups, 12-step groups, and problem-specific support groups to expand supports.  They also worked as a group on developing a specific plan for several patients to deal with unhealthy supports through boundary-setting, psychoeducation with loved ones, and even termination of relationships.   Therapeutic Goals:   1)  discuss importance of adding supports to stay well once out of the hospital  2)  compare healthy versus unhealthy supports and identify some examples of each  3)  generate ideas and descriptions of healthy supports that can be added  4)  offer mutual support about how to address unhealthy supports  5)  encourage active participation in and adherence to discharge plan    Summary of Patient Progress:  The patient did not attend.  Therapeutic Modalities:   Motivational Interviewing Brief Solution-Focused Therapy  Zanobia Griebel D Lenord Fralix         

## 2021-01-04 NOTE — Progress Notes (Signed)
   01/04/21 0137  COVID-19 Daily Checkoff  Have you had a fever (temp > 37.80C/100F)  in the past 24 hours?  No  If you have had runny nose, nasal congestion, sneezing in the past 24 hours, has it worsened? No  COVID-19 EXPOSURE  Have you traveled outside the state in the past 14 days? No  Have you been in contact with someone with a confirmed diagnosis of COVID-19 or PUI in the past 14 days without wearing appropriate PPE? No  Have you been living in the same home as a person with confirmed diagnosis of COVID-19 or a PUI (household contact)? No  Have you been diagnosed with COVID-19? No

## 2021-01-04 NOTE — Progress Notes (Signed)
   01/04/21 0140  Psych Admission Type (Psych Patients Only)  Admission Status Voluntary  Psychosocial Assessment  Patient Complaints Substance abuse  Eye Contact Fair  Facial Expression Flat;Anxious  Affect Appropriate to circumstance  Speech Logical/coherent  Interaction Assertive  Motor Activity Fidgety;Restless  Appearance/Hygiene Disheveled  Behavior Characteristics Appropriate to situation  Mood Anxious;Pleasant  Thought Process  Coherency WDL  Content Blaming self  Delusions None reported or observed  Perception WDL  Hallucination None reported or observed  Judgment Impaired  Confusion None  Danger to Self  Current suicidal ideation? Denies  Danger to Others  Danger to Others None reported or observed

## 2021-01-04 NOTE — Progress Notes (Signed)
   01/04/21 2316  COVID-19 Daily Checkoff  Have you had a fever (temp > 37.80C/100F)  in the past 24 hours?  No  If you have had runny nose, nasal congestion, sneezing in the past 24 hours, has it worsened? No  COVID-19 EXPOSURE  Have you traveled outside the state in the past 14 days? No  Have you been in contact with someone with a confirmed diagnosis of COVID-19 or PUI in the past 14 days without wearing appropriate PPE? No  Have you been living in the same home as a person with confirmed diagnosis of COVID-19 or a PUI (household contact)? No  Have you been diagnosed with COVID-19? No

## 2021-01-04 NOTE — Progress Notes (Signed)
Patient presents with an anxious mood. She appears fidgety and is unable to sit or stand still for a long period of time. She reported not feeling well today and stated that she started having withdrawal symptoms last night. She reported withdrawal symptoms of agitation, anxiety, sweats, and chills. She was given PRN meds and clonidine per protocol. She denied nausea, vomiting and diarrhea. She denies SI/HI. She denies AVH. She reported that she is tired of using drugs and is interested in going to residential substance abuse treatment.   Orders reviewed. Vital signs reviewed. Verbal support provided. Fluids encouraged and provided. 15 minute checks performed for safety.   Patient compliant with taking medications and denies any side effects. She has been unable to attend groups due to not feeling well/withdrawal symptoms today.

## 2021-01-04 NOTE — Progress Notes (Signed)
Patient did not attend wrap up group. 

## 2021-01-05 DIAGNOSIS — F319 Bipolar disorder, unspecified: Secondary | ICD-10-CM

## 2021-01-05 LAB — LAMOTRIGINE LEVEL: Lamotrigine Lvl: <1 ug/mL — ABNORMAL LOW (ref 2.0–20.0)

## 2021-01-05 LAB — HEMOGLOBIN A1C
Hgb A1c MFr Bld: 4.5 % — ABNORMAL LOW (ref 4.8–5.6)
Mean Plasma Glucose: 82.45 mg/dL

## 2021-01-05 NOTE — Progress Notes (Signed)
[facility] MD Progress Note  01/05/2021 12:51 PM Sandra Garza  MRN:  [id number]  Subjective: "I feel okay, a little better than yesterday but not great"  Objective: This is the first psychiatric admission assessment in this [facility] for this 35 year old Caucasian female with hx of mental health issues, opioid use disorder & methamphetamine. She is admitted to the [facility] from the [facility] ED with need for possible opioid detoxification & mood stabilization treatments. Chart review reports indicated that patient was brought to the [facility] ED by the EMS with chief complaints of erratic behaviors in public. And while at the ED, she was noted to be agitated, fidgety, manic, pacing around & talking to herself. Patient apparently has issues with heroin & methamphetamine addictions. She is on disability based on her mental health issues (Bipolar disorder). Sandra Garza is a mother of 3 children who are being raised by her mother. Her UDS was positive for: Amphetamine, Opioid & THC.  Evaluation on the unit: Sandra Garza is seen, chart reviewed and case discussed with the treatment team. She is lying down in her bed, knees drawn up to her abdomen. She is restless. She says she feels a little better today but still feels like crap. She complained of her whole body hurting but also stated she knows she has to get through the withdrawal. She is taking the COWS protocol medications offered to her for opiate withdrawal symptom management. She stated her appetite is good and she slept well last night, record reflects 6.75 hours.  She did attend group this morning. She denies any SIHI, AVH, delusions or paranoia. Support & encouragement provided.  Principal Problem: Bipolar 1 disorder (HCC)  Diagnosis: Principal Problem:   Bipolar 1 disorder (HCC) Active Problems:   Opioid use disorder, severe, dependence (HCC)   Methamphetamine use disorder, severe, dependence (HCC)  Total Time spent with patient: 25  minutes  Past Psychiatric History: See H&P.  Past Medical History:  Past Medical History:  Diagnosis Date  . ADHD   . Anxiety   . Asthma   . Bipolar 1 disorder, depressed (HCC)   . Drug abuse (HCC)   . Hypercholesteremia   . Pinched nerve in neck    C 6-7    Past Surgical History:  Procedure Laterality Date  . ADENOIDECTOMY    . CESAREAN SECTION    . CYSTECTOMY     on overy  . TONSILLECTOMY    . TUBAL LIGATION    . WISDOM TOOTH EXTRACTION     Family History:  Family History  Problem Relation Age of Onset  . Heart disease Maternal Grandmother    Family Psychiatric  History: See H&P  Social History:  Social History   Substance and Sexual Activity  Alcohol Use Not Currently     Social History   Substance and Sexual Activity  Drug Use Yes  . Types: Marijuana, Cocaine, Methamphetamines    Social History   Socioeconomic History  . Marital status: Single    Spouse name: Not on file  . Number of children: Not on file  . Years of education: Not on file  . Highest education level: Not on file  Occupational History  . Not on file  Tobacco Use  . Smoking status: Current Every Day Smoker    Packs/day: 0.75    Types: Cigarettes    Start date: 12/26/2006  . Smokeless tobacco: Never Used  Vaping Use  . Vaping Use: Never used  Substance and Sexual Activity  .  Alcohol use: Not Currently  . Drug use: Yes    Types: Marijuana, Cocaine, Methamphetamines  . Sexual activity: Yes    Birth control/protection: Surgical  Other Topics Concern  . Not on file  Social History Narrative  . Not on file   Social Determinants of Health   Financial Resource Strain: Not on file  Food Insecurity: Not on file  Transportation Needs: Not on file  Physical Activity: Not on file  Stress: Not on file  Social Connections: Not on file   Additional Social History:   Sleep: Fair  Appetite:  Fair  Current Medications: Current Facility-Administered Medications  Medication Dose  Route Frequency Provider Last Rate Last Admin  . acetaminophen (TYLENOL) tablet 650 mg  650 mg Oral Q6H PRN Antonieta Pertlary, Greg Lawson, MD      . alum & mag hydroxide-simeth (MAALOX/MYLANTA) 200-200-20 MG/5ML suspension 30 mL  30 mL Oral Q4H PRN Antonieta Pertlary, Greg Lawson, MD      . cloNIDine (CATAPRES) tablet 0.1 mg  0.1 mg Oral QID Melbourne Abtsaylor, Cody W, PA-C   0.1 mg at 01/05/21 1208   Followed by  . [START ON 01/06/2021] cloNIDine (CATAPRES) tablet 0.1 mg  0.1 mg Oral BH-qamhs Jaclyn Shaggyaylor, Cody W, PA-C       Followed by  . [START ON 01/09/2021] cloNIDine (CATAPRES) tablet 0.1 mg  0.1 mg Oral QAC breakfast Melbourne Abtsaylor, Cody W, PA-C      . dicyclomine (BENTYL) tablet 20 mg  20 mg Oral Q6H PRN Jaclyn Shaggyaylor, Cody W, PA-C   20 mg at 01/04/21 2150  . hydrOXYzine (ATARAX/VISTARIL) tablet 25 mg  25 mg Oral TID PRN Antonieta Pertlary, Greg Lawson, MD   25 mg at 01/05/21 0908  . loperamide (IMODIUM) capsule 2-4 mg  2-4 mg Oral PRN Melbourne Abtsaylor, Cody W, PA-C      . magnesium hydroxide (MILK OF MAGNESIA) suspension 30 mL  30 mL Oral Daily PRN Antonieta Pertlary, Greg Lawson, MD      . methocarbamol (ROBAXIN) tablet 500 mg  500 mg Oral Q8H PRN Jaclyn Shaggyaylor, Cody W, PA-C   500 mg at 01/04/21 1717  . naproxen (NAPROSYN) tablet 500 mg  500 mg Oral BID PRN Jaclyn Shaggyaylor, Cody W, PA-C   500 mg at 01/04/21 2150  . nicotine (NICODERM CQ - dosed in mg/24 hours) patch 21 mg  21 mg Transdermal Daily Antonieta Pertlary, Greg Lawson, MD   21 mg at 01/05/21 0858  . ondansetron (ZOFRAN-ODT) disintegrating tablet 4 mg  4 mg Oral Q6H PRN Jaclyn Shaggyaylor, Cody W, PA-C   4 mg at 01/04/21 2149  . traZODone (DESYREL) tablet 50 mg  50 mg Oral QHS PRN Antonieta Pertlary, Greg Lawson, MD   50 mg at 01/04/21 2150   Lab Results:  Results for orders placed or performed during the hospital encounter of 01/03/21 (from the past 48 hour(s))  Lipid panel     Status: None   Collection Time: 01/04/21  5:41 PM  Result Value Ref Range   Cholesterol 142 0 - 200 mg/dL   Triglycerides 161146 <096<150 mg/dL   HDL 42 >04>40 mg/dL   Total CHOL/HDL Ratio 3.4 RATIO    VLDL 29 0 - 40 mg/dL   LDL Cholesterol 71 0 - 99 mg/dL    Comment:        Total Cholesterol/HDL:CHD Risk Coronary Heart Disease Risk Table                     Men   Women  1/2 Average Risk   3.4  3.3  Average Risk       5.0   4.4  2 X Average Risk   9.6   7.1  3 X Average Risk  23.4   11.0        Use the calculated Patient Ratio above and the CHD Risk Table to determine the patient's CHD Risk.        ATP III CLASSIFICATION (LDL):  <100     mg/dL   Optimal  098-119  mg/dL   Near or Above                    Optimal  130-159  mg/dL   Borderline  147-829  mg/dL   High  >562     mg/dL   Very High Performed at [facility], [address] [place], [place] [address]   Hemoglobin A1c     Status: Abnormal   Collection Time: 01/04/21  5:41 PM  Result Value Ref Range   Hgb A1c MFr Bld 4.5 (L) 4.8 - 5.6 %    Comment: (NOTE) Pre diabetes:          5.7%-6.4%  Diabetes:              >6.4%  Glycemic control for   <7.0% adults with diabetes    Mean Plasma Glucose 82.45 mg/dL    Comment: Performed at [facility], [address], [place], [place] [address]  TSH     Status: Abnormal   Collection Time: 01/04/21  5:41 PM  Result Value Ref Range   TSH 0.229 (L) 0.350 - 4.500 uIU/mL    Comment: Performed by a 3rd Generation assay with a functional sensitivity of <=0.01 uIU/mL. Performed at [facility], [address], [place], [place] [address]     Blood Alcohol level:  Lab Results  Component Value Date   [facility] <10 01/02/2021   ETH <10 12/31/2020   Metabolic Disorder Labs: Lab Results  Component Value Date   HGBA1C 4.5 (L) 01/04/2021   MPG 82.45 01/04/2021   No results found for: PROLACTIN Lab Results  Component Value Date   CHOL 142 01/04/2021   TRIG 146 01/04/2021   HDL 42 01/04/2021   CHOLHDL 3.4 01/04/2021   VLDL 29 01/04/2021   LDLCALC 71 01/04/2021    Physical Findings: AIMS: Facial and Oral Movements Muscles of  Facial Expression: None, normal Lips and Perioral Area: None, normal Jaw: None, normal Tongue: None, normal,Extremity Movements Upper (arms, wrists, hands, fingers): None, normal Lower (legs, knees, ankles, toes): None, normal, Trunk Movements Neck, shoulders, hips: None, normal, Overall Severity Severity of abnormal movements (highest score from questions above): None, normal Incapacitation due to abnormal movements: None, normal Patient's awareness of abnormal movements (rate only patient's report): No Awareness, Dental Status Current problems with teeth and/or dentures?: No Does patient usually wear dentures?: No  CIWA:  CIWA-Ar Total: 4 COWS:  COWS Total Score: 7  Musculoskeletal: Strength & Muscle Tone: within normal limits Gait & Station: normal Patient leans: N/A  Psychiatric Specialty Exam: Physical Exam Vitals and nursing note reviewed.  HENT:     Head: Normocephalic.     Nose: Nose normal.     Mouth/Throat:     Pharynx: Oropharynx is clear.  Eyes:     Pupils: Pupils are equal, round, and reactive to light.  Cardiovascular:     Pulses: Normal pulses.  Pulmonary:     Effort: Pulmonary effort is normal.  Genitourinary:  Comments: Deferred Musculoskeletal:        General: Normal range of motion.     Cervical back: Normal range of motion.  Skin:    General: Skin is warm and dry.  Neurological:     General: No focal deficit present.     Mental Status: She is alert and oriented to person, place, and time.     Review of Systems  Constitutional: Negative for chills, diaphoresis and fever.  HENT: Negative for congestion, rhinorrhea, sneezing and sore throat.   Eyes: Negative for discharge.  Respiratory: Negative for cough, shortness of breath and wheezing.   Cardiovascular: Negative for chest pain and palpitations.  Gastrointestinal: Negative for diarrhea, nausea and vomiting.  Endocrine: Negative for cold intolerance.  Genitourinary: Negative for difficulty  urinating.  Musculoskeletal: Negative for arthralgias and myalgias.  Skin: Negative for color change.  Allergic/Immunologic: Negative for environmental allergies and food allergies.       Allergies: Ceclor  Neurological: Negative for dizziness, tremors, seizures, syncope, facial asymmetry, speech difficulty, weakness, light-headedness, numbness and headaches.  Psychiatric/Behavioral: Positive for dysphoric mood. Negative for agitation, behavioral problems, confusion, decreased concentration, self-injury, sleep disturbance and suicidal ideas. The patient is not nervous/anxious and is not hyperactive.     Blood pressure 118/82, pulse 93, temperature 98.1 F (36.7 C), temperature source Oral, resp. rate 16, height 5\' 4"  (1.626 m), weight 59 kg, last menstrual period 01/03/2021, SpO2 99 %.Body mass index is 22.31 kg/m.  General Appearance: Disheveled, fair hygiene, wearing her own clothing.   Eye Contact:  Fair  Speech:  Clear and Coherent and Slow  Volume:  Decreased  Mood:  Anxious, Dysphoric and Irritable  Affect:  Congruent and Flat  Thought Process:  Coherent and Descriptions of Associations: Intact  Orientation:  Full (Time, Place, and Person)  Thought Content:  Logical and Hallucinations: None  Suicidal Thoughts:  Denies  Homicidal Thoughts:  Denies  Memory:  Immediate;   Fair Recent;   Fair Remote;   Fair  Judgement:  Fair  Insight:  Fair  Psychomotor Activity:  Restlessness, fidgeting, rocking back and forth on her bed  Concentration:  Concentration: Poor and Attention Span: Poor  Recall:  01/05/2021 of Knowledge:  Fair  Language:  Good  Akathisia:  Negative  Handed:  Right  AIMS (if indicated):     Assets:  Communication Skills Desire for Improvement Resilience  ADL's:  Intact  Cognition:  WNL  Sleep:  Number of Hours: 6.75   Treatment Plan Summary: Daily contact with patient to assess and evaluate symptoms and progress in treatment and Medication  management.   Will continue today 01/05/2021 plan as below except where it is noted:   Opioid withdrawal Management: -Continue the COWS detox protocol.   -Administer medications as needed for symptom management  Nicotine withdrawal management: -Continue the Nicotine patch 21 mg topically Q 24 hrs  Insomnia: -Continue Trazodone 50 mg po Q bedtime po prn  Rouitne PRNs. -Continue Tylenol 650 mg po Q 6 hrs prn for pain/fever -Continue Mylanta 30 ml po Q 4 hrs prn for indigestion -Continue MOM 30 ml po prn daily for constipation   -Continue inpatient hospitalization -Continue 15 minute safety checks -Encourage group participation -Discharge disposition plan in progress   01/07/2021, NP, PMHNP 01/05/2021, 12:51 PM

## 2021-01-05 NOTE — Plan of Care (Signed)
Nurse discussed anxiety, depression and coping skills with patient.  

## 2021-01-05 NOTE — Progress Notes (Signed)
Recreation Therapy Notes  Date:  2.28.22 Time: 0930 Location: [address] Hall Dayroom  Group Topic: Stress Management  Goal Area(s) Addresses:  Patient will identify positive stress management techniques. Patient will identify benefits of using stress management post d/c.  Behavioral Response:  Engaged  Intervention: Stress Management  Activity:  Meditation.  LRT played a meditation from the Calm app that focused on making the most of your day by focusing on something you want to accomplish.  Patients were to listen and follow along meditation played to engage in activity.    Education:  Stress Management, Discharge Planning.   Education Outcome: Acknowledges Education  Clinical Observations/Feedback: Pt attended and participated in activity.    Caroll Rancher, LRT/CTRS         Caroll Rancher A 01/05/2021 11:17 AM

## 2021-01-05 NOTE — Tx Team (Signed)
Interdisciplinary Treatment and Diagnostic Plan Update  01/05/2021 Time of Session: 9:35am Sandra Garza MRN: [id number]  Principal Diagnosis: Bipolar 1 disorder (HCC)  Secondary Diagnoses: Principal Problem:   Bipolar 1 disorder (HCC) Active Problems:   Opioid use disorder, severe, dependence (HCC)   Methamphetamine use disorder, severe, dependence (HCC)   Current Medications:  Current Facility-Administered Medications  Medication Dose Route Frequency Provider Last Rate Last Admin  . acetaminophen (TYLENOL) tablet 650 mg  650 mg Oral Q6H PRN Antonieta Pert, MD      . alum & mag hydroxide-simeth (MAALOX/MYLANTA) 200-200-20 MG/5ML suspension 30 mL  30 mL Oral Q4H PRN Antonieta Pert, MD      . cloNIDine (CATAPRES) tablet 0.1 mg  0.1 mg Oral QID Melbourne Abts W, PA-C   0.1 mg at 01/05/21 0857   Followed by  . [START ON 01/06/2021] cloNIDine (CATAPRES) tablet 0.1 mg  0.1 mg Oral BH-qamhs Jaclyn Shaggy, PA-C       Followed by  . [START ON 01/09/2021] cloNIDine (CATAPRES) tablet 0.1 mg  0.1 mg Oral QAC breakfast Melbourne Abts W, PA-C      . dicyclomine (BENTYL) tablet 20 mg  20 mg Oral Q6H PRN Jaclyn Shaggy, PA-C   20 mg at 01/04/21 2150  . hydrOXYzine (ATARAX/VISTARIL) tablet 25 mg  25 mg Oral TID PRN Antonieta Pert, MD   25 mg at 01/05/21 0908  . loperamide (IMODIUM) capsule 2-4 mg  2-4 mg Oral PRN Melbourne Abts W, PA-C      . magnesium hydroxide (MILK OF MAGNESIA) suspension 30 mL  30 mL Oral Daily PRN Antonieta Pert, MD      . methocarbamol (ROBAXIN) tablet 500 mg  500 mg Oral Q8H PRN Jaclyn Shaggy, PA-C   500 mg at 01/04/21 1717  . naproxen (NAPROSYN) tablet 500 mg  500 mg Oral BID PRN Jaclyn Shaggy, PA-C   500 mg at 01/04/21 2150  . nicotine (NICODERM CQ - dosed in mg/24 hours) patch 21 mg  21 mg Transdermal Daily Antonieta Pert, MD   21 mg at 01/05/21 0858  . ondansetron (ZOFRAN-ODT) disintegrating tablet 4 mg  4 mg Oral Q6H PRN Jaclyn Shaggy, PA-C   4 mg at  01/04/21 2149  . traZODone (DESYREL) tablet 50 mg  50 mg Oral QHS PRN Antonieta Pert, MD   50 mg at 01/04/21 2150   PTA Medications: Medications Prior to Admission  Medication Sig Dispense Refill Last Dose  . FLUoxetine (PROZAC) 20 MG capsule Take 20 mg by mouth daily.     Marland Kitchen gabapentin (NEURONTIN) 300 MG capsule Take 300 mg by mouth 2 (two) times daily.     Marland Kitchen lamoTRIgine (LAMICTAL) 100 MG tablet Take 100 mg by mouth 2 (two) times daily.     . melatonin 3 MG TABS tablet Take 3 mg by mouth at bedtime as needed for sleep.       Patient Stressors: Paediatric nurse issue Loss of Custody of children  Substance abuse  Patient Strengths: Ability for insight Motivation for treatment/growth  Treatment Modalities: Medication Management, Group therapy, Case management,  1 to 1 session with clinician, Psychoeducation, Recreational therapy.   Physician Treatment Plan for Primary Diagnosis: Bipolar 1 disorder (HCC) Long Term Goal(s): Improvement in symptoms so as ready for discharge Improvement in symptoms so as ready for discharge   Short Term Goals: Ability to identify changes in lifestyle to reduce recurrence of condition will improve Ability to  verbalize feelings will improve Ability to disclose and discuss suicidal ideas Ability to demonstrate self-control will improve Ability to identify and develop effective coping behaviors will improve Compliance with prescribed medications will improve Ability to identify triggers associated with substance abuse/mental health issues will improve  Medication Management: Evaluate patient's response, side effects, and tolerance of medication regimen.  Therapeutic Interventions: 1 to 1 sessions, Unit Group sessions and Medication administration.  Evaluation of Outcomes: Progressing  Physician Treatment Plan for Secondary Diagnosis: Principal Problem:   Bipolar 1 disorder (HCC) Active Problems:   Opioid use disorder, severe,  dependence (HCC)   Methamphetamine use disorder, severe, dependence (HCC)  Long Term Goal(s): Improvement in symptoms so as ready for discharge Improvement in symptoms so as ready for discharge   Short Term Goals: Ability to identify changes in lifestyle to reduce recurrence of condition will improve Ability to verbalize feelings will improve Ability to disclose and discuss suicidal ideas Ability to demonstrate self-control will improve Ability to identify and develop effective coping behaviors will improve Compliance with prescribed medications will improve Ability to identify triggers associated with substance abuse/mental health issues will improve     Medication Management: Evaluate patient's response, side effects, and tolerance of medication regimen.  Therapeutic Interventions: 1 to 1 sessions, Unit Group sessions and Medication administration.  Evaluation of Outcomes: Progressing   RN Treatment Plan for Primary Diagnosis: Bipolar 1 disorder (HCC) Long Term Goal(s): Knowledge of disease and therapeutic regimen to maintain health will improve  Short Term Goals: Ability to remain free from injury will improve, Ability to verbalize frustration and anger appropriately will improve, Ability to identify and develop effective coping behaviors will improve and Compliance with prescribed medications will improve  Medication Management: RN will administer medications as ordered by provider, will assess and evaluate patient's response and provide education to patient for prescribed medication. RN will report any adverse and/or side effects to prescribing provider.  Therapeutic Interventions: 1 on 1 counseling sessions, Psychoeducation, Medication administration, Evaluate responses to treatment, Monitor vital signs and CBGs as ordered, Perform/monitor CIWA, COWS, AIMS and Fall Risk screenings as ordered, Perform wound care treatments as ordered.  Evaluation of Outcomes: Progressing   LCSW  Treatment Plan for Primary Diagnosis: Bipolar 1 disorder (HCC) Long Term Goal(s): Safe transition to appropriate next level of care at discharge, Engage patient in therapeutic group addressing interpersonal concerns.  Short Term Goals: Engage patient in aftercare planning with referrals and resources, Increase social support, Identify triggers associated with mental health/substance abuse issues and Increase skills for wellness and recovery  Therapeutic Interventions: Assess for all discharge needs, 1 to 1 time with Social worker, Explore available resources and support systems, Assess for adequacy in community support network, Educate family and significant other(s) on suicide prevention, Complete Psychosocial Assessment, Interpersonal group therapy.  Evaluation of Outcomes: Progressing   Progress in Treatment: Attending groups: Yes. Participating in groups: Yes. Taking medication as prescribed: Yes. Toleration medication: Yes. Family/Significant other contact made: No, will contact:  mother Patient understands diagnosis: Yes. Discussing patient identified problems/goals with staff: Yes. Medical problems stabilized or resolved: Yes. Denies suicidal/homicidal ideation: Yes. Issues/concerns per patient self-inventory: No.   New problem(s) identified: No, Describe:  none  New Short Term/Long Term Goal(s): detox, medication management for mood stabilization; elimination of SI thoughts; development of comprehensive mental wellness/sobriety plan   Patient Goals:  "To get medicine, housing, rehab, the streets are bad for me"  Discharge Plan or Barriers: Patient is interested in residential substance use treatment post  discharge.  Reason for Continuation of Hospitalization: Anxiety Depression Medication stabilization Withdrawal symptoms  Estimated Length of Stay: 3-5 days  Attendees: Patient: Sandra Garza  01/05/2021  Physician: Landry Mellow, MD 01/05/2021   Nursing:  01/05/2021   RN  Care Manager: 01/05/2021   Social Worker: Ruthann Cancer, LCSW 01/05/2021   Recreational Therapist:  01/05/2021   Other:  01/05/2021   Other:  01/05/2021  Other: 01/05/2021      Scribe for Treatment Team: Otelia Santee, LCSW 01/05/2021 11:51 AM

## 2021-01-05 NOTE — Progress Notes (Signed)
   01/05/21 2100  Psych Admission Type (Psych Patients Only)  Admission Status Voluntary  Psychosocial Assessment  Patient Complaints Anxiety;Depression;Substance abuse  Eye Contact Fair  Facial Expression Anxious  Affect Appropriate to circumstance  Speech Logical/coherent  Interaction Assertive  Motor Activity Other (Comment) (wnl)  Appearance/Hygiene Unremarkable  Behavior Characteristics Cooperative;Anxious  Mood Depressed;Anxious;Pleasant  Aggressive Behavior  Effect No apparent injury  Thought Process  Coherency WDL  Content Blaming self  Delusions None reported or observed  Perception WDL  Hallucination None reported or observed  Judgment Impaired  Confusion None  Danger to Self  Current suicidal ideation? Denies  Danger to Others  Danger to Others None reported or observed   Pt denies SI, HI, AVH and pain. Endorses nausea and generalized body aches as withdrawal symptoms. Pt rates anxiety 5/10 and depression 6/10. Pleasant and cooperative.

## 2021-01-05 NOTE — Progress Notes (Signed)
Patient states that she had a better day since she didn't vomit. Her goal for tomorrow is to find long-term drug treatment.

## 2021-01-05 NOTE — [facility] Group Notes (Signed)
LCSW Group Therapy Note 01/05/2021 1:15pm  Type of Therapy and Topic:  Group Therapy:  Setting Goals  Participation Level:  Did Not Attend  Description of Group: In this process group, patients discussed using strengths to work toward goals and address challenges.  Patients identified two positive things about themselves and one goal they were working on.  Patients were given the opportunity to share openly and support each other's plan for self-empowerment.  The group discussed the value of gratitude and were encouraged to have a daily reflection of positive characteristics or circumstances.  Patients were encouraged to identify a plan to utilize their strengths to work on current challenges and goals.  Therapeutic Goals 1. Patient will verbalize personal strengths/positive qualities and relate how these can assist with achieving desired personal goals 2. Patients will verbalize affirmation of peers plans for personal change and goal setting 3. Patients will explore the value of gratitude and positive focus as related to successful achievement of goals 4. Patients will verbalize a plan for regular reinforcement of personal positive qualities and circumstances.  Summary of Patient Progress:  Did Not Attend   Therapeutic Modalities Cognitive Behavioral Therapy Motivational Interviewing    Sandra Garza 01/05/2021 3:47 PM

## 2021-01-05 NOTE — Progress Notes (Signed)
Spiritual care group on grief and loss facilitated by chaplain Haadi Santellan MDiv, BCC  Group Goal:  Support / Education around grief and loss Members engage in facilitated group support and psycho-social education.  Group Description:  Following introductions and group rules, group members engaged in facilitated group dialog and support around topic of loss, with particular support around experiences of loss in their lives. Group Identified types of loss (relationships / self / things) and identified patterns, circumstances, and changes that precipitate losses. Reflected on thoughts / feelings around loss, normalized grief responses, and recognized variety in grief experience.   Group noted Worden's four tasks of grief in discussion.  Group drew on Adlerian / Rogerian, narrative, MI, Patient Progress:   Did not attend  

## 2021-01-05 NOTE — BHH Counselor (Signed)
Pt was given homelessness resource packets and [employer].   Sandra Garza, LCSWA Clinicial Social Worker [employer]

## 2021-01-05 NOTE — Progress Notes (Signed)
D:  Patient denied SI and HI, contracts for safety.  Denied A/V hallucinations.  Denied pain. A:  Medications administered per MD orders.  Emotional support and encouragement given patient. R:  Safety maintained with 15 minute checks.  
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
## 2021-01-06 DIAGNOSIS — F1120 Opioid dependence, uncomplicated: Secondary | ICD-10-CM

## 2021-01-06 DIAGNOSIS — F1520 Other stimulant dependence, uncomplicated: Secondary | ICD-10-CM | POA: Diagnosis not present

## 2021-01-06 DIAGNOSIS — F319 Bipolar disorder, unspecified: Secondary | ICD-10-CM | POA: Diagnosis not present

## 2021-01-06 NOTE — Progress Notes (Signed)
Pt presents restless / fidgety on interactions with multiple shifts in position even when standing. Denies SI, HI, AVH and pain when assessed. Pt had an emesis this morning after breakfast. Received PRN Zofran and Vistaril earlier (See EMAR) as ordered for nausea / vomiting and anxiety. Reports relief when reassessed. Observed interacting well with peers in milieu this this shift and getting her needs met safely. Tolerated lunch and dinner better in comparison to breakfast. Attended scheduled groups and off unit activities when prompted.   Emotional support, reassurance and encouragement provided to pt throughout this shift. Q 15 minutes safety checks maintained without self harm gestures or outburst to note thus far. Scheduled and PRN medications given with verbal education and effects monitored.  Pt denies concerns at this time. Remains safe on and off unit. Watching TV in dayroom without issue.

## 2021-01-06 NOTE — Progress Notes (Signed)
Recreation Therapy Notes  Animal-Assisted Activity (AAA) Program Checklist/Progress Notes Patient Eligibility Criteria Checklist & Daily Group note for Rec Tx Intervention  Date: 01/06/2021 Time: 3:00pm Location: [address]   AAA/T Program Assumption of Risk Form signed by Patient/ or Parent Legal Guardian YES  Patient is free of allergies or severe asthma YES  Patient reports no fear of animals YES  Patient reports no history of cruelty to animals YES  Patient understands their participation is voluntary YES  Patient washes hands before animal contact YES  Patient washes hands after animal contact YES  Behavioral Response: Active  Education: Charity fundraiser, Appropriate Animal Interaction   Education Outcome: Acknowledges understanding  Clinical Observations/Feedback: Pt attended group session and interacted appropriately with peers, staff, Teaching laboratory technician and therapy dog, Bodi. Pt shared with dog handler, Thayer Ohm that they previously trained dogs to get the aniamals ready for adoption. Pt used commands 'sit and paw' with therapy dog to 'shake hands'.   Sandra Garza, LRT/CTRS Benito Mccreedy Laurrie Toppin 01/06/2021, 4:23 PM

## 2021-01-06 NOTE — Progress Notes (Signed)
[facility] MD Progress Note  01/06/2021 1:34 PM Sandra Garza  MRN:  [id number]  Subjective: "My stomach hurts and I did not sleep good"  Objective: This is the first psychiatric admission assessment in this [facility] for this 35 year old Caucasian female with hx of mental health issues, opioid use disorder & methamphetamine. She is admitted to the [facility] from the [facility] ED with need for possible opioid detoxification & mood stabilization treatments. Chart review reports indicated that patient was brought to the [facility] ED by the EMS with chief complaints of erratic behaviors in public. And while at the ED, she was noted to be agitated, fidgety, manic, pacing around & talking to herself. Patient apparently has issues with heroin & methamphetamine addictions. She is on disability based on her mental health issues (Bipolar disorder). Sandra Garza is a mother of 3 children who are being raised by her mother. Her UDS was positive for: Amphetamine, Opioid & THC.  Evaluation on the unit: Sandra Garza is seen, chart reviewed and case discussed with the treatment team. She is visible on the unit today, interacting appropriately with her peers. She is attending group therapy. She denies SI/HI/AVH, paranoia and delusions. She stated her stomach hurts today and her appetite is poor. She stated she threw up while in the bathroom this morning. She continues to be on the COWS protocol with appropriate medications administered based on her withdrawal symptoms. She appears to be feeling better today. She does not complain of body aches and is less restless today. She is interested in rehab for substance abuse when she is discharged. She was provided with homeless resources and [employer] today.  Support & encouragement provided.  Principal Problem: Bipolar 1 disorder (HCC)  Diagnosis: Principal Problem:   Bipolar 1 disorder (HCC) Active Problems:   Opioid use disorder, severe, dependence (HCC)    Methamphetamine use disorder, severe, dependence (HCC)  Total Time spent with patient: 25 minutes  Past Psychiatric History: See H&P.  Past Medical History:  Past Medical History:  Diagnosis Date  . ADHD   . Anxiety   . Asthma   . Bipolar 1 disorder, depressed (HCC)   . Drug abuse (HCC)   . Hypercholesteremia   . Pinched nerve in neck    C 6-7    Past Surgical History:  Procedure Laterality Date  . ADENOIDECTOMY    . CESAREAN SECTION    . CYSTECTOMY     on overy  . TONSILLECTOMY    . TUBAL LIGATION    . WISDOM TOOTH EXTRACTION     Family History:  Family History  Problem Relation Age of Onset  . Heart disease Maternal Grandmother    Family Psychiatric  History: See H&P  Social History:  Social History   Substance and Sexual Activity  Alcohol Use Not Currently     Social History   Substance and Sexual Activity  Drug Use Yes  . Types: Marijuana, Cocaine, Methamphetamines    Social History   Socioeconomic History  . Marital status: Single    Spouse name: Not on file  . Number of children: Not on file  . Years of education: Not on file  . Highest education level: Not on file  Occupational History  . Not on file  Tobacco Use  . Smoking status: Current Every Day Smoker    Packs/day: 0.75    Types: Cigarettes    Start date: 12/26/2006  . Smokeless tobacco: Never Used  Vaping Use  .  Vaping Use: Never used  Substance and Sexual Activity  . Alcohol use: Not Currently  . Drug use: Yes    Types: Marijuana, Cocaine, Methamphetamines  . Sexual activity: Yes    Birth control/protection: Surgical  Other Topics Concern  . Not on file  Social History Narrative  . Not on file   Social Determinants of Health   Financial Resource Strain: Not on file  Food Insecurity: Not on file  Transportation Needs: Not on file  Physical Activity: Not on file  Stress: Not on file  Social Connections: Not on file   Additional Social History:   Sleep: Fair  Appetite:   Fair  Current Medications: Current Facility-Administered Medications  Medication Dose Route Frequency Provider Last Rate Last Admin  . acetaminophen (TYLENOL) tablet 650 mg  650 mg Oral Q6H PRN Antonieta Pert, MD   650 mg at 01/05/21 2111  . alum & mag hydroxide-simeth (MAALOX/MYLANTA) 200-200-20 MG/5ML suspension 30 mL  30 mL Oral Q4H PRN Antonieta Pert, MD      . cloNIDine (CATAPRES) tablet 0.1 mg  0.1 mg Oral BH-qamhs Melbourne Abts W, PA-C       Followed by  . [START ON 01/09/2021] cloNIDine (CATAPRES) tablet 0.1 mg  0.1 mg Oral QAC breakfast Melbourne Abts W, PA-C      . dicyclomine (BENTYL) tablet 20 mg  20 mg Oral Q6H PRN Melbourne Abts W, PA-C   20 mg at 01/06/21 7893  . hydrOXYzine (ATARAX/VISTARIL) tablet 25 mg  25 mg Oral TID PRN Antonieta Pert, MD   25 mg at 01/06/21 0934  . loperamide (IMODIUM) capsule 2-4 mg  2-4 mg Oral PRN Jaclyn Shaggy, PA-C   2 mg at 01/06/21 8101  . magnesium hydroxide (MILK OF MAGNESIA) suspension 30 mL  30 mL Oral Daily PRN Antonieta Pert, MD      . methocarbamol (ROBAXIN) tablet 500 mg  500 mg Oral Q8H PRN Melbourne Abts W, PA-C   500 mg at 01/05/21 1459  . naproxen (NAPROSYN) tablet 500 mg  500 mg Oral BID PRN Jaclyn Shaggy, PA-C   500 mg at 01/05/21 1458  . nicotine (NICODERM CQ - dosed in mg/24 hours) patch 21 mg  21 mg Transdermal Daily Antonieta Pert, MD   21 mg at 01/06/21 1010  . ondansetron (ZOFRAN-ODT) disintegrating tablet 4 mg  4 mg Oral Q6H PRN Jaclyn Shaggy, PA-C   4 mg at 01/06/21 1008  . traZODone (DESYREL) tablet 50 mg  50 mg Oral QHS PRN Antonieta Pert, MD   50 mg at 01/05/21 2111   Lab Results:  Results for orders placed or performed during the hospital encounter of 01/03/21 (from the past 48 hour(s))  Lipid panel     Status: None   Collection Time: 01/04/21  5:41 PM  Result Value Ref Range   Cholesterol 142 0 - 200 mg/dL   Triglycerides 751 <025 mg/dL   HDL 42 >85 mg/dL   Total CHOL/HDL Ratio 3.4 RATIO   VLDL 29  0 - 40 mg/dL   LDL Cholesterol 71 0 - 99 mg/dL    Comment:        Total Cholesterol/HDL:CHD Risk Coronary Heart Disease Risk Table                     Men   Women  1/2 Average Risk   3.4   3.3  Average Risk       5.0  4.4  2 X Average Risk   9.6   7.1  3 X Average Risk  23.4   11.0        Use the calculated Patient Ratio above and the CHD Risk Table to determine the patient's CHD Risk.        ATP III CLASSIFICATION (LDL):  <100     mg/dL   Optimal  [id number]  mg/dL   Near or Above                    Optimal  130-159  mg/dL   Borderline  [id number]  mg/dL   High  >811>190     mg/dL   Very High Performed at [facility], [address] [place], [place] [address]   Hemoglobin A1c     Status: Abnormal   Collection Time: 01/04/21  5:41 PM  Result Value Ref Range   Hgb A1c MFr Bld 4.5 (L) 4.8 - 5.6 %    Comment: (NOTE) Pre diabetes:          5.7%-6.4%  Diabetes:              >6.4%  Glycemic control for   <7.0% adults with diabetes    Mean Plasma Glucose 82.45 mg/dL    Comment: Performed at [facility], [address] [place], [place] [address]  TSH     Status: Abnormal   Collection Time: 01/04/21  5:41 PM  Result Value Ref Range   TSH 0.229 (L) 0.350 - 4.500 uIU/mL    Comment: Performed by a 3rd Generation assay with a functional sensitivity of <=0.01 uIU/mL. Performed at [facility], [address] [place], [place] [address]     Blood Alcohol level:  Lab Results  Component Value Date   [facility] <10 01/02/2021   ETH <10 12/31/2020   Metabolic Disorder Labs: Lab Results  Component Value Date   HGBA1C 4.5 (L) 01/04/2021   MPG 82.45 01/04/2021   No results found for: PROLACTIN Lab Results  Component Value Date   CHOL 142 01/04/2021   TRIG 146 01/04/2021   HDL 42 01/04/2021   CHOLHDL 3.4 01/04/2021   VLDL 29 01/04/2021   LDLCALC 71 01/04/2021    Physical Findings: AIMS: Facial and Oral Movements Muscles of Facial  Expression: None, normal Lips and Perioral Area: None, normal Jaw: None, normal Tongue: None, normal,Extremity Movements Upper (arms, wrists, hands, fingers): None, normal Lower (legs, knees, ankles, toes): None, normal, Trunk Movements Neck, shoulders, hips: None, normal, Overall Severity Severity of abnormal movements (highest score from questions above): None, normal Incapacitation due to abnormal movements: None, normal Patient's awareness of abnormal movements (rate only patient's report): No Awareness, Dental Status Current problems with teeth and/or dentures?: No Does patient usually wear dentures?: No  CIWA:  CIWA-Ar Total: 4 COWS:  COWS Total Score: 3  Musculoskeletal: Strength & Muscle Tone: within normal limits Gait & Station: normal Patient leans: N/A  Psychiatric Specialty Exam: Physical Exam Vitals and nursing note reviewed.  HENT:     Head: Normocephalic.     Nose: Nose normal.     Mouth/Throat:     Pharynx: Oropharynx is clear.  Eyes:     Pupils: Pupils are equal, round, and reactive to light.  Cardiovascular:     Pulses: Normal pulses.  Pulmonary:     Effort: Pulmonary effort is normal.  Genitourinary:    Comments: Deferred Musculoskeletal:  General: Normal range of motion.     Cervical back: Normal range of motion.  Skin:    General: Skin is warm and dry.  Neurological:     General: No focal deficit present.     Mental Status: She is alert and oriented to person, place, and time.     Review of Systems  Constitutional: Negative for chills, diaphoresis and fever.  HENT: Negative for congestion, rhinorrhea, sneezing and sore throat.   Eyes: Negative for discharge.  Respiratory: Negative for cough, shortness of breath and wheezing.   Cardiovascular: Negative for chest pain and palpitations.  Gastrointestinal: Negative for diarrhea, nausea and vomiting.  Endocrine: Negative for cold intolerance.  Genitourinary: Negative for difficulty  urinating.  Musculoskeletal: Negative for arthralgias and myalgias.  Skin: Negative for color change.  Allergic/Immunologic: Negative for environmental allergies and food allergies.       Allergies: Ceclor  Neurological: Negative for dizziness, tremors, seizures, syncope, facial asymmetry, speech difficulty, weakness, light-headedness, numbness and headaches.  Psychiatric/Behavioral: Positive for dysphoric mood. Negative for agitation, behavioral problems, confusion, decreased concentration, self-injury, sleep disturbance and suicidal ideas. The patient is not nervous/anxious and is not hyperactive.     Blood pressure 124/89, pulse 86, temperature 98.5 F (36.9 C), temperature source Oral, resp. rate 16, height 5\' 4"  (1.626 m), weight 59 kg, last menstrual period 01/03/2021, SpO2 99 %.Body mass index is 22.31 kg/m.  General Appearance: Disheveled, fair hygiene, wearing her own clothing.   Eye Contact:  Good  Speech:  Clear and Coherent  Volume:  Normal  Mood:  Anxious and Depressed  Affect:  Appropriate, Congruent and Depressed  Thought Process:  Coherent and Descriptions of Associations: Intact  Orientation:  Full (Time, Place, and Person)  Thought Content:  Logical and Hallucinations: None  Suicidal Thoughts:  Denies  Homicidal Thoughts:  Denies  Memory:  Immediate;   Fair Recent;   Fair Remote;   Fair  Judgement:  Fair  Insight:  Fair  Psychomotor Activity:  Increased  Concentration:  Concentration: Fair and Attention Span: Fair  Recall:  01/05/2021 of Knowledge:  Fair  Language:  Good  Akathisia:  Negative  Handed:  Right  AIMS (if indicated):     Assets:  Communication Skills Desire for Improvement Resilience  ADL's:  Intact  Cognition:  WNL  Sleep:  Number of Hours: 5.75   Treatment Plan Summary: Daily contact with patient to assess and evaluate symptoms and progress in treatment and Medication management.   Will continue today 01/06/2021 plan as below except where  it is noted:   Opioid withdrawal Management: -Continue the COWS detox protocol.   -Administer medications as needed for symptom management  Nicotine withdrawal management: -Continue the Nicotine patch 21 mg topically Q 24 hrs  Insomnia: -Continue Trazodone 50 mg po Q bedtime po prn  Rouitne PRNs. -Continue Tylenol 650 mg po Q 6 hrs prn for pain/fever -Continue Mylanta 30 ml po Q 4 hrs prn for indigestion -Continue MOM 30 ml po prn daily for constipation   -Continue inpatient hospitalization -Continue 15 minute safety checks -Encourage group participation -Discharge disposition plan in progress   03/08/2021, NP, PMHNP 01/06/2021, 1:34 PM

## 2021-01-06 NOTE — Progress Notes (Signed)
Adult Psychoeducational Group Note  Date:  01/06/2021 Time:  9:06 PM  Group Topic/Focus:  Wrap-Up Group:   The focus of this group is to help patients review their daily goal of treatment and discuss progress on daily workbooks.  Participation Level:  Active  Participation Quality:  Appropriate  Affect:  Appropriate  Cognitive:  Appropriate  Insight: Appropriate  Engagement in Group:  Engaged  Modes of Intervention:  Discussion  Additional Comments:  Patient said her day was a 4. Her goal for today was to hold food down. Yes she was able to keep food down. Her coping skills ask for help.  Sandra Garza 01/06/2021, 9:06 PM

## 2021-01-06 NOTE — [facility] Suicide Risk Assessment (Addendum)
[facility] INPATIENT:  Family/Significant Other Suicide Prevention Education  Suicide Prevention Education:  Contact Attempts: Malena Timpone (mother) [phone], (name of family member/significant other) has been identified by the patient as the family member/significant other with whom the patient will be residing, and identified as the person(s) who will aid the patient in the event of a mental health crisis.  With written consent from the patient, two attempts were made to provide suicide prevention education, prior to and/or following the patient's discharge.  We were unsuccessful in providing suicide prevention education.  A suicide education pamphlet was given to the patient to share with family/significant other. CSW placed pamphlet in pt's chart.   Date and time of first attempt: 01-06-21 2:50. CSW was unable to leave a message due to voicemail box being full.  Date and time of second attempt: 01/07/21 3:55.  CSW was unable to leave a message due to voicemail box being full.   Felizardo Hoffmann 01/06/2021, 2:59 PM

## 2021-01-06 NOTE — Progress Notes (Signed)
Adult Psychoeducational Group Note  Date:  01/06/2021 Time:  4:48 PM  Group Topic/Focus:  Goals Group:   The focus of this group is to help patients establish daily goals to achieve during treatment and discuss how the patient can incorporate goal setting into their daily lives to aide in recovery.  Participation Level:  Active  Participation Quality:  Appropriate  Affect:  Appropriate  Cognitive:  Alert  Insight: Appropriate  Engagement in Group:  Engaged  Modes of Intervention:  Discussion  Additional Comments:  Pt attended group and participated in discussion.  Ashlei Chinchilla R Tallyn Holroyd 01/06/2021, 4:48 PM

## 2021-01-07 DIAGNOSIS — F319 Bipolar disorder, unspecified: Secondary | ICD-10-CM | POA: Diagnosis not present

## 2021-01-07 MED ORDER — GABAPENTIN 100 MG PO CAPS
200.0000 mg | ORAL_CAPSULE | Freq: Two times a day (BID) | ORAL | Status: DC
  Administered 2021-01-07 – 2021-01-11 (×8): 200 mg via ORAL
  Filled 2021-01-07 (×12): qty 2

## 2021-01-07 MED ORDER — TRAZODONE HCL 50 MG PO TABS
50.0000 mg | ORAL_TABLET | Freq: Every evening | ORAL | Status: DC | PRN
  Administered 2021-01-07 – 2021-01-11 (×7): 50 mg via ORAL
  Filled 2021-01-07 (×15): qty 1

## 2021-01-07 MED ORDER — MELATONIN 3 MG PO TABS
3.0000 mg | ORAL_TABLET | Freq: Every day | ORAL | Status: DC
  Administered 2021-01-07 – 2021-01-11 (×5): 3 mg via ORAL
  Filled 2021-01-07 (×4): qty 1
  Filled 2021-01-07: qty 21
  Filled 2021-01-07 (×3): qty 1

## 2021-01-07 MED ORDER — FLUOXETINE HCL 20 MG PO CAPS
20.0000 mg | ORAL_CAPSULE | Freq: Every day | ORAL | Status: DC
  Administered 2021-01-07 – 2021-01-12 (×6): 20 mg via ORAL
  Filled 2021-01-07 (×2): qty 1
  Filled 2021-01-07: qty 7
  Filled 2021-01-07 (×7): qty 1

## 2021-01-07 NOTE — [facility] Counselor (Signed)
Pt has been accepted to [facility] but there are no beds at this time.   Fredirick Lathe, LCSWA Clinicial Social Worker [employer]

## 2021-01-07 NOTE — Plan of Care (Signed)
Nurse discussed anxiety, depression and coping skills with patient.  

## 2021-01-07 NOTE — Progress Notes (Signed)
D: Patient reports some anxiety at time of assessment and presents with anxious affect. Patient is cooperative with assessment and medication compliant at this time. Patient denies SI/HI at this time. Patient also denies AH/VH at this time. Patient contracts for safety.  A: Provided positive reinforcement and encouragement.  R: Patient cooperative and receptive to efforts. Patient remains safe on the unit.   01/06/21 2058  Psych Admission Type (Psych Patients Only)  Admission Status Voluntary  Psychosocial Assessment  Patient Complaints Anxiety  Eye Contact Fair  Facial Expression Anxious  Affect Appropriate to circumstance  Speech Logical/coherent;Rapid  Interaction Assertive  Motor Activity Fidgety  Appearance/Hygiene Unremarkable  Behavior Characteristics Cooperative;Appropriate to situation  Mood Anxious  Thought Process  Coherency WDL  Content WDL  Delusions None reported or observed  Perception WDL  Hallucination None reported or observed  Judgment Impaired  Confusion None  Danger to Self  Current suicidal ideation? Denies  Danger to Others  Danger to Others None reported or observed

## 2021-01-07 NOTE — Tx Team (Signed)
Interdisciplinary Treatment and Diagnostic Plan Update  01/07/2021 Time of Session: 9:35am Sandra Garza MRN: [id number]  Principal Diagnosis: Bipolar 1 disorder (HCC)  Secondary Diagnoses: Principal Problem:   Bipolar 1 disorder (HCC) Active Problems:   Opioid use disorder, severe, dependence (HCC)   Methamphetamine use disorder, severe, dependence (HCC)   Current Medications:  Current Facility-Administered Medications  Medication Dose Route Frequency Provider Last Rate Last Admin  . acetaminophen (TYLENOL) tablet 650 mg  650 mg Oral Q6H PRN Antonieta Pert, MD   650 mg at 01/05/21 2111  . alum & mag hydroxide-simeth (MAALOX/MYLANTA) 200-200-20 MG/5ML suspension 30 mL  30 mL Oral Q4H PRN Antonieta Pert, MD      . cloNIDine (CATAPRES) tablet 0.1 mg  0.1 mg Oral BH-qamhs [place] Abts W, PA-C   0.1 mg at 01/07/21 0734   Followed by  . [START ON 01/09/2021] cloNIDine (CATAPRES) tablet 0.1 mg  0.1 mg Oral QAC breakfast Melbourne Abts W, PA-C      . dicyclomine (BENTYL) tablet 20 mg  20 mg Oral Q6H PRN Melbourne Abts W, PA-C   20 mg at 01/06/21 6387  . hydrOXYzine (ATARAX/VISTARIL) tablet 25 mg  25 mg Oral TID PRN Antonieta Pert, MD   25 mg at 01/07/21 0734  . loperamide (IMODIUM) capsule 2-4 mg  2-4 mg Oral PRN Jaclyn Shaggy, PA-C   2 mg at 01/06/21 5643  . magnesium hydroxide (MILK OF MAGNESIA) suspension 30 mL  30 mL Oral Daily PRN Antonieta Pert, MD      . methocarbamol (ROBAXIN) tablet 500 mg  500 mg Oral Q8H PRN Melbourne Abts W, PA-C   500 mg at 01/05/21 1459  . naproxen (NAPROSYN) tablet 500 mg  500 mg Oral BID PRN Jaclyn Shaggy, PA-C   500 mg at 01/05/21 1458  . nicotine (NICODERM CQ - dosed in mg/24 hours) patch 21 mg  21 mg Transdermal Daily Antonieta Pert, MD   21 mg at 01/07/21 0734  . ondansetron (ZOFRAN-ODT) disintegrating tablet 4 mg  4 mg Oral Q6H PRN Jaclyn Shaggy, PA-C   4 mg at 01/06/21 1008  . traZODone (DESYREL) tablet 50 mg  50 mg Oral QHS PRN Antonieta Pert, MD   50 mg at 01/06/21 2058   PTA Medications: Medications Prior to Admission  Medication Sig Dispense Refill Last Dose  . FLUoxetine (PROZAC) 20 MG capsule Take 20 mg by mouth daily.     Marland Kitchen gabapentin (NEURONTIN) 300 MG capsule Take 300 mg by mouth 2 (two) times daily.     Marland Kitchen lamoTRIgine (LAMICTAL) 100 MG tablet Take 100 mg by mouth 2 (two) times daily.     . melatonin 3 MG TABS tablet Take 3 mg by mouth at bedtime as needed for sleep.       Patient Stressors: Paediatric nurse issue Loss of Custody of children  Substance abuse  Patient Strengths: Ability for insight Motivation for treatment/growth  Treatment Modalities: Medication Management, Group therapy, Case management,  1 to 1 session with clinician, Psychoeducation, Recreational therapy.   Physician Treatment Plan for Primary Diagnosis: Bipolar 1 disorder (HCC) Long Term Goal(s): Improvement in symptoms so as ready for discharge Improvement in symptoms so as ready for discharge   Short Term Goals: Ability to identify changes in lifestyle to reduce recurrence of condition will improve Ability to verbalize feelings will improve Ability to disclose and discuss suicidal ideas Ability to demonstrate self-control will improve Ability to identify and  develop effective coping behaviors will improve Compliance with prescribed medications will improve Ability to identify triggers associated with substance abuse/mental health issues will improve  Medication Management: Evaluate patient's response, side effects, and tolerance of medication regimen.  Therapeutic Interventions: 1 to 1 sessions, Unit Group sessions and Medication administration.  Evaluation of Outcomes: Progressing  Physician Treatment Plan for Secondary Diagnosis: Principal Problem:   Bipolar 1 disorder (HCC) Active Problems:   Opioid use disorder, severe, dependence (HCC)   Methamphetamine use disorder, severe, dependence (HCC)  Long Term  Goal(s): Improvement in symptoms so as ready for discharge Improvement in symptoms so as ready for discharge   Short Term Goals: Ability to identify changes in lifestyle to reduce recurrence of condition will improve Ability to verbalize feelings will improve Ability to disclose and discuss suicidal ideas Ability to demonstrate self-control will improve Ability to identify and develop effective coping behaviors will improve Compliance with prescribed medications will improve Ability to identify triggers associated with substance abuse/mental health issues will improve     Medication Management: Evaluate patient's response, side effects, and tolerance of medication regimen.  Therapeutic Interventions: 1 to 1 sessions, Unit Group sessions and Medication administration.  Evaluation of Outcomes: Progressing   RN Treatment Plan for Primary Diagnosis: Bipolar 1 disorder (HCC) Long Term Goal(s): Knowledge of disease and therapeutic regimen to maintain health will improve  Short Term Goals: Ability to remain free from injury will improve, Ability to verbalize frustration and anger appropriately will improve, Ability to identify and develop effective coping behaviors will improve and Compliance with prescribed medications will improve  Medication Management: RN will administer medications as ordered by provider, will assess and evaluate patient's response and provide education to patient for prescribed medication. RN will report any adverse and/or side effects to prescribing provider.  Therapeutic Interventions: 1 on 1 counseling sessions, Psychoeducation, Medication administration, Evaluate responses to treatment, Monitor vital signs and CBGs as ordered, Perform/monitor CIWA, COWS, AIMS and Fall Risk screenings as ordered, Perform wound care treatments as ordered.  Evaluation of Outcomes: Progressing   LCSW Treatment Plan for Primary Diagnosis: Bipolar 1 disorder (HCC) Long Term Goal(s): Safe  transition to appropriate next level of care at discharge, Engage patient in therapeutic group addressing interpersonal concerns.  Short Term Goals: Engage patient in aftercare planning with referrals and resources, Increase social support, Identify triggers associated with mental health/substance abuse issues and Increase skills for wellness and recovery  Therapeutic Interventions: Assess for all discharge needs, 1 to 1 time with Social worker, Explore available resources and support systems, Assess for adequacy in community support network, Educate family and significant other(s) on suicide prevention, Complete Psychosocial Assessment, Interpersonal group therapy.  Evaluation of Outcomes: Progressing   Progress in Treatment: Attending groups: Yes. Participating in groups: Yes. Taking medication as prescribed: Yes. Toleration medication: Yes. Family/Significant other contact made: No, will contact:  mother Patient understands diagnosis: Yes. Discussing patient identified problems/goals with staff: Yes. Medical problems stabilized or resolved: Yes. Denies suicidal/homicidal ideation: Yes. Issues/concerns per patient self-inventory: No.   New problem(s) identified: No, Describe:  none  New Short Term/Long Term Goal(s): detox, medication management for mood stabilization; elimination of SI thoughts; development of comprehensive mental wellness/sobriety plan   Patient Goals:  "To get medicine, housing, rehab, the streets are bad for me"  Discharge Plan or Barriers: Patient is interested in residential substance use treatment post discharge.  Reason for Continuation of Hospitalization: Anxiety Depression Medication stabilization Withdrawal symptoms  Estimated Length of Stay: 3-5 days  Attendees: Patient:Sandra Garza 01/07/2021   Physician: Landry Mellow, MD 01/07/2021   Nursing:  01/07/2021   RN Care Manager: 01/07/2021   Social Worker: Fredirick Lathe, LCSWA 01/07/2021   Recreational  Therapist:  01/07/2021   Other:  01/07/2021   Other:  01/07/2021   Other: 01/07/2021       Scribe for Treatment Team: Felizardo Hoffmann, Theresia Majors 01/07/2021 3:39 PM

## 2021-01-07 NOTE — Progress Notes (Signed)
Recreation Therapy Notes  Date: 01/07/2021 Time: 930a Location: [address] Dayroom  Group Topic: Stress Management  Goal Area(s) Addresses:  Patient will identify positive stress management techniques. Patient will identify benefits of using stress management post d/c.  Behavioral Response: Engaged  Intervention: Relaxation, Therapeutic Coloring  Activity: Mandalas and Music. Patients were provided the choice of various mandala and positive mantra coloring pages to reflect on during the group session. Patient used colored pencils to create their own patterns and practice artistic expression. LRT used a calming playlist of instrumental music and spa sounds to promote mindfulness and relaxation.  Education:  Stress Management, Discharge Planning.   Education Outcome: Acknowledges Education  Clinical Observations/Feedback: Pt was talkative throughout group session. At times pt noted to seek attention and reactions of other peers and staff. Pt made a remark that "colored pencils are free if you just throw them in your cart". Pt selected positive mantra coloring page reading "Stronger than yesterday".   Ilsa Iha, LRT/CTRS Benito Mccreedy Amyla Heffner 01/07/2021, 12:53 PM

## 2021-01-07 NOTE — Progress Notes (Signed)
D:  Patient denied SI and HI, contracts for safety.  Denied A/V hallucinations.   A:  Medications administered per MD orders.  Emotional support and encouragement given patient. R:  Safety maintained with 15 minute checks.  

## 2021-01-07 NOTE — Progress Notes (Signed)
The coping skill that the patient wishes to use following discharge is to go hiking. She also shared that she is currently working on finding a place to go such as long-term drug rehab.

## 2021-01-07 NOTE — [facility] Group Notes (Signed)
LCSW Group Therapy Note  Type of Therapy/Topic: Group Therapy: Six Dimensions of Wellness  Participation Level: Active  Description of Group:  This group will address the concept of wellness and the six concepts of wellness: occupational, physical, social, intellectual, spiritual, and emotional. Patients will be encouraged to process areas in their lives that are out of balance and identify reasons for remaining unbalanced. Patients will be encouraged to explore ways to practice healthy habits daily to attain better physical and mental health outcomes.  Therapeutic Goals:  1. Identify aspects of wellness that they are doing well.  2. Identify aspects of wellness that they would like to improve upon.  3. Identify one action they can take to improve an aspect of wellness in their lives.  Summary of Patient Progress:  Sandra Garza spent time discussing wellness with her peers during a recreational activity.

## 2021-01-07 NOTE — Progress Notes (Signed)
Adult Psychoeducational Group Note  Date:  01/07/2021 Time:  5:50 PM  Group Topic/Focus:  Goals Group:   The focus of this group is to help patients establish daily goals to achieve during treatment and discuss how the patient can incorporate goal setting into their daily lives to aide in recovery.  Participation Level:  Active  Participation Quality:  Appropriate  Affect:  Appropriate  Cognitive:  Alert  Insight: Appropriate  Engagement in Group:  Engaged  Modes of Intervention:  Discussion  Additional Comments:  Pt attended group and participated in discussion  Ravinder Lukehart R Lucciana Head 01/07/2021, 5:50 PM

## 2021-01-07 NOTE — Progress Notes (Signed)
[facility]  01/07/2021 4:22 PM Sandra Garza  MRN:  [id number]  Subjective: "My stomach hurts and I did not sleep good"  Objective: This is the first psychiatric [facility] assessment [facility] for this 35 year old Caucasian female with hx of mental health issues, opioid use disorder & methamphetamine. [facility] with need for possible opioid detoxification & mood stabilization treatments. Chart review reports indicated that patient [facility] by the EMS with chief complaints of erratic behaviors in public. And while at the ED, she was noted to be agitated, fidgety, manic, pacing around & talking to herself. Patient apparently has issues with heroin & methamphetamine addictions. She is on disability based on her mental health issues (Bipolar disorder). Sandra Garza is a mother of 3 children who are being raised by her mother. Her UDS was positive for: Amphetamine, Opioid & THC.  Evaluation on the unit: Sandra Garza is seen, chart reviewed and case discussed with the treatment team. She is visible on the unit today, interacting appropriately with her peers. She is attending group therapy. She denies SI/HI/AVH, paranoia and delusions. She stated she is feeling better today with no vomiting. Her appetite is good. Her sleep is fair, she stated she wakes up around 3 and stays awake until after breakfast then goes back to sleep. She takes Melatonin at home, will restart this tonight. She appears brighter today, she is calm and cooperative. She is still fidgety but less today. She is attending to her personal hygiene. She is requesting to have her Bipolar medications restarted. Will resume Prozac and Gabapentin today. She was previously on Lamictal, will hold off on this for now. She continues to be on the COWS protocol with appropriate medications administered if needed.  She was accepted to [facility] for substance abuse treatment but  there are no beds available as of today. She was provided with homeless resources and [facility] information today. She stated she got a hold of [facility] today and stated "I am trying."  Support & encouragement provided.  Principal Problem: Bipolar 1 disorder (HCC)  Diagnosis: Principal Problem:   Bipolar 1 disorder (HCC) Active Problems:   Opioid use disorder, severe, dependence (HCC)   Methamphetamine use disorder, severe, dependence (HCC)  Total Time spent with patient: 25 minutes  Past Psychiatric History: See H&P.  Past Medical History:  Past Medical History:  Diagnosis Date  . ADHD   . Anxiety   . Asthma   . Bipolar 1 disorder, depressed (HCC)   . Drug abuse (HCC)   . Hypercholesteremia   . Pinched nerve in neck    C 6-7    Past Surgical History:  Procedure Laterality Date  . ADENOIDECTOMY    . CESAREAN SECTION    . CYSTECTOMY     on overy  . TONSILLECTOMY    . TUBAL LIGATION    . WISDOM TOOTH EXTRACTION     Family History:  Family History  Problem Relation Age of Onset  . Heart disease Maternal Grandmother    Family Psychiatric  History: See H&P  Social History:  Social History   Substance and Sexual Activity  Alcohol Use Not Currently     Social History   Substance and Sexual Activity  Drug Use Yes  . Types: Marijuana, Cocaine, Methamphetamines    Social History   Socioeconomic History  . Marital status: Single    Spouse name:  Not on file  . Number of children: Not on file  . Years of education: Not on file  . Highest education level: Not on file  Occupational History  . Not on file  Tobacco Use  . Smoking status: Current Every Day Smoker    Packs/day: 0.75    Types: Cigarettes    Start date: 12/26/2006  . Smokeless tobacco: Never Used  Vaping Use  . Vaping Use: Never used  Substance and Sexual Activity  . Alcohol use: Not Currently  . Drug use: Yes    Types: Marijuana, Cocaine, Methamphetamines  . Sexual activity: Yes     Birth control/protection: Surgical  Other Topics Concern  . Not on file  Social History Narrative  . Not on file   Social Determinants of Health   Financial Resource Strain: Not on file  Food Insecurity: Not on file  Transportation Needs: Not on file  Physical Activity: Not on file  Stress: Not on file  Social Connections: Not on file   Additional Social History:   Sleep: Fair  Appetite:  Good  Current Medications: Current Facility-Administered Medications  Medication Dose Route Frequency Provider Last Rate Last Admin  . acetaminophen (TYLENOL) tablet 650 mg  650 mg Oral Q6H PRN Antonieta Pert, MD   650 mg at 01/05/21 2111  . alum & mag hydroxide-simeth (MAALOX/MYLANTA) 200-200-20 MG/5ML suspension 30 mL  30 mL Oral Q4H PRN Antonieta Pert, MD      . cloNIDine (CATAPRES) tablet 0.1 mg  0.1 mg Oral BH-qamhs Melbourne Abts W, PA-C   0.1 mg at 01/07/21 0734   Followed by  . [START ON 01/09/2021] cloNIDine (CATAPRES) tablet 0.1 mg  0.1 mg Oral QAC breakfast Melbourne Abts W, PA-C      . dicyclomine (BENTYL) tablet 20 mg  20 mg Oral Q6H PRN Melbourne Abts W, PA-C   20 mg at 01/06/21 5465  . hydrOXYzine (ATARAX/VISTARIL) tablet 25 mg  25 mg Oral TID PRN Antonieta Pert, MD   25 mg at 01/07/21 0734  . loperamide (IMODIUM) capsule 2-4 mg  2-4 mg Oral PRN Jaclyn Shaggy, PA-C   2 mg at 01/06/21 6812  . magnesium hydroxide (MILK OF MAGNESIA) suspension 30 mL  30 mL Oral Daily PRN Antonieta Pert, MD      . methocarbamol (ROBAXIN) tablet 500 mg  500 mg Oral Q8H PRN Melbourne Abts W, PA-C   500 mg at 01/05/21 1459  . naproxen (NAPROSYN) tablet 500 mg  500 mg Oral BID PRN Jaclyn Shaggy, PA-C   500 mg at 01/05/21 1458  . nicotine (NICODERM CQ - dosed in mg/24 hours) patch 21 mg  21 mg Transdermal Daily Antonieta Pert, MD   21 mg at 01/07/21 0734  . ondansetron (ZOFRAN-ODT) disintegrating tablet 4 mg  4 mg Oral Q6H PRN Jaclyn Shaggy, PA-C   4 mg at 01/06/21 1008  . traZODone (DESYREL)  tablet 50 mg  50 mg Oral QHS PRN Antonieta Pert, MD   50 mg at 01/06/21 2058   Lab Results:  No results found for this or any previous visit (from the past 48 hour(s)).  Blood Alcohol level:  Lab Results  Component Value Date   [facility] <10 01/02/2021   ETH <10 12/31/2020   Metabolic Disorder Labs: Lab Results  Component Value Date   HGBA1C 4.5 (L) 01/04/2021   MPG 82.45 01/04/2021   No results found for: PROLACTIN Lab Results  Component Value Date  CHOL 142 01/04/2021   TRIG 146 01/04/2021   HDL 42 01/04/2021   CHOLHDL 3.4 01/04/2021   VLDL 29 01/04/2021   LDLCALC 71 01/04/2021    Physical Findings: AIMS: Facial and Oral Movements Muscles of Facial Expression: None, normal Lips and Perioral Area: None, normal Jaw: None, normal Tongue: None, normal,Extremity Movements Upper (arms, wrists, hands, fingers): None, normal Lower (legs, knees, ankles, toes): None, normal, Trunk Movements Neck, shoulders, hips: None, normal, Overall Severity Severity of abnormal movements (highest score from questions above): None, normal Incapacitation due to abnormal movements: None, normal Patient's awareness of abnormal movements (rate only patient's report): No Awareness, Dental Status Current problems with teeth and/or dentures?: No Does patient usually wear dentures?: No  CIWA:  CIWA-Ar Total: 4 COWS:  COWS Total Score: 1  Musculoskeletal: Strength & Muscle Tone: within normal limits Gait & Station: normal Patient leans: N/A  Psychiatric Specialty Exam: Physical Exam Vitals and nursing note reviewed.  HENT:     Head: Normocephalic.     Nose: Nose normal.     Mouth/Throat:     Pharynx: Oropharynx is clear.  Eyes:     Pupils: Pupils are equal, round, and reactive to light.  Cardiovascular:     Pulses: Normal pulses.  Pulmonary:     Effort: Pulmonary effort is normal.  Genitourinary:    Comments: Deferred Musculoskeletal:        General: Normal range of motion.      Cervical back: Normal range of motion.  Skin:    General: Skin is warm and dry.  Neurological:     General: No focal deficit present.     Mental Status: She is alert and oriented to person, place, and time.     Review of Systems  Constitutional: Negative for chills, diaphoresis and fever.  HENT: Negative for congestion, rhinorrhea, sneezing and sore throat.   Eyes: Negative for discharge.  Respiratory: Negative for cough, shortness of breath and wheezing.   Cardiovascular: Negative for chest pain and palpitations.  Gastrointestinal: Negative for diarrhea, nausea and vomiting.  Endocrine: Negative for cold intolerance.  Genitourinary: Negative for difficulty urinating.  Musculoskeletal: Negative for arthralgias and myalgias.  Skin: Negative for color change.  Allergic/Immunologic: Negative for environmental allergies and food allergies.       Allergies: Ceclor  Neurological: Negative for dizziness, tremors, seizures, syncope, facial asymmetry, speech difficulty, weakness, light-headedness, numbness and headaches.  Psychiatric/Behavioral: Positive for dysphoric mood. Negative for agitation, behavioral problems, confusion, decreased concentration, self-injury, sleep disturbance and suicidal ideas. The patient is not nervous/anxious and is not hyperactive.     Blood pressure 115/87, pulse 91, temperature 97.8 F (36.6 C), temperature source Oral, resp. rate 16, height 5\' 4"  (1.626 m), weight 59 kg, last menstrual period 01/03/2021, SpO2 100 %.Body mass index is 22.31 kg/m.  General Appearance: Casual, Fairly Groomed and attending to her hygiene needs   Eye Contact:  Good  Speech:  Clear and Coherent  Volume:  Normal  Mood:  Anxious, Depressed and Improving  Affect:  Appropriate, Congruent and Depressed - Improved  Thought Process:  Coherent and Descriptions of Associations: Intact  Orientation:  Full (Time, Place, and Person)  Thought Content:  Logical and Hallucinations: None   Suicidal Thoughts:  Denies  Homicidal Thoughts:  Denies  Memory:  Immediate;   Fair Recent;   Fair Remote;   Fair  Judgement:  Fair  Insight:  Fair  Psychomotor Activity:  Increased and still somewhat fidgety  Concentration:  Concentration: Fair and  Attention Span: Fair  Recall:  [employer] of Knowledge:  Fair  Language:  Good  Akathisia:  No  Handed:  Right  AIMS (if indicated):     Assets:  Communication Skills Desire for Improvement Resilience  ADL's:  Intact  Cognition:  WNL  Sleep:  Number of Hours: 6.75   Treatment Plan Summary: Daily contact with patient to assess and evaluate symptoms and progress in treatment and Medication management.   Will continue today 01/07/2021 plan as below except where it is noted:   Bipolar 1 Disorder:  -Start Prozac 20 mg PO daily  Opioid withdrawal Management: -Continue the COWS detox protocol.   -Administer medications as needed for symptom management -Start Gabapentin 200 mg PO BID  Nicotine withdrawal management: -Continue the Nicotine patch 21 mg topically Q 24 hrs  Insomnia: -Start Melatonin 3 mg PO at bedtime -Continue Trazodone 50 mg po Q bedtime po prn, may repeat x 1  Rouitne PRNs. -Continue Tylenol 650 mg po Q 6 hrs prn for pain/fever -Continue Mylanta 30 ml po Q 4 hrs prn for indigestion -Continue MOM 30 ml po prn daily for constipation   -Continue inpatient hospitalization -Continue 15 minute safety checks -Encourage group participation -Discharge disposition plan in progress   Laveda Abbe, NP, PMHNP 01/07/2021, 4:22 PM
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
## 2021-01-08 NOTE — Progress Notes (Incomplete)
   01/08/21 0604  Vital Signs  Temp (!) 97.4 F (36.3 C)  Temp Source Oral  Pulse Rate 86  BP 107/80  BP Location Left Arm  BP Method Automatic  Patient Position (if appropriate) Sitting  Oxygen Therapy  SpO2 100 %

## 2021-01-08 NOTE — Progress Notes (Signed)
Adult Psychoeducational Group Note  Date:  01/08/2021 Time:  11:03 PM  Group Topic/Focus:  Wrap-Up Group:   The focus of this group is to help patients review their daily goal of treatment and discuss progress on daily workbooks.  Participation Level:  Active  Participation Quality:  Appropriate  Affect:  Appropriate  Cognitive:  Appropriate  Insight: Appropriate  Engagement in Group:  Engaged  Modes of Intervention:  Discussion  Additional Comments:  Patient said her day was 6. Her goal for to day was to find ....she did not meet her goal. Her coping skills was to breath and count to 10.  Sandra Garza Long 01/08/2021, 11:03 PM

## 2021-01-08 NOTE — Progress Notes (Signed)
[facility] MD Progress Note  01/08/2021 3:42 PM Elayjah Chaney  MRN:  [id number] Subjective: Patient was seen and interviewed today in the patient's room.  Chart was reviewed.  The patient was discussed in detail this morning in treatment team rounds with other members of the treatment team.  The patient is a 35 year old Caucasian female with a history of mental health issues including bipolar 1 disorder as well as opioid use disorder methamphetamine use disorder.  The patient was admitted for possible opioid detoxification and mood stabilization treatments.  Urine drug screen was positive on admission for amphetamines, opioids and THC.   On interview today, the patient reports that she is feeling better.  She continues to struggle with some middle and late insomnia.  She was better able to return to sleep last night.  The patient reports her energy is improved.  Anxiety is diminished with Vistaril.  The patient denies depression and states that she scores a 0 out of 10 on the depression scale today.  The patient denies any wishes not to be alive or any suicidal ideation intent preparation or plan.  She reports last experiencing suicidal ideation at least several days ago.  The patient denies any assaultive ideation or homicidal ideation.  She denies any history of past violence or aggression.  Patient reports a remote history of 1 prior suicide attempt at age 12 at which time she took an overdose of Wellbutrin.  She denies access to firearm.  She denies auditory or visual hallucinations.  She denies paranoid ideation.  Appetite is okay.  The patient plans to go to a drug treatment rehabilitation program after discharge that has transitional housing.  She is awaiting a bed.  She denies any medication side effects.  She denies any physical problems.   Principal Problem: Bipolar 1 disorder (HCC) Diagnosis: Principal Problem:   Bipolar 1 disorder (HCC) Active Problems:   Opioid use disorder, severe, dependence  (HCC)   Methamphetamine use disorder, severe, dependence (HCC)  Total Time spent with patient: 30 minutes  Past Psychiatric History: See H&P  Past Medical History:  Past Medical History:  Diagnosis Date  . ADHD   . Anxiety   . Asthma   . Bipolar 1 disorder, depressed (HCC)   . Drug abuse (HCC)   . Hypercholesteremia   . Pinched nerve in neck    C 6-7    Past Surgical History:  Procedure Laterality Date  . ADENOIDECTOMY    . CESAREAN SECTION    . CYSTECTOMY     on overy  . TONSILLECTOMY    . TUBAL LIGATION    . WISDOM TOOTH EXTRACTION     Family History:  Family History  Problem Relation Age of Onset  . Heart disease Maternal Grandmother    Family Psychiatric  History: See H&P.  Denies family history of suicide.  Social History:  Social History   Substance and Sexual Activity  Alcohol Use Not Currently     Social History   Substance and Sexual Activity  Drug Use Yes  . Types: Marijuana, Cocaine, Methamphetamines    Social History   Socioeconomic History  . Marital status: Single    Spouse name: Not on file  . Number of children: Not on file  . Years of education: Not on file  . Highest education level: Not on file  Occupational History  . Not on file  Tobacco Use  . Smoking status: Current Every Day Smoker    Packs/day: 0.75  Types: Cigarettes    Start date: 12/26/2006  . Smokeless tobacco: Never Used  Vaping Use  . Vaping Use: Never used  Substance and Sexual Activity  . Alcohol use: Not Currently  . Drug use: Yes    Types: Marijuana, Cocaine, Methamphetamines  . Sexual activity: Yes    Birth control/protection: Surgical  Other Topics Concern  . Not on file  Social History Narrative  . Not on file   Social Determinants of Health   Financial Resource Strain: Not on file  Food Insecurity: Not on file  Transportation Needs: Not on file  Physical Activity: Not on file  Stress: Not on file  Social Connections: Not on file   Additional  Social History:                         Sleep: Fair.  Patient continues to report middle and late insomnia but was able to return to sleep after middle insomnia last night.  Appetite:  Fair  Current Medications: Current Facility-Administered Medications  Medication Dose Route Frequency Provider Last Rate Last Admin  . acetaminophen (TYLENOL) tablet 650 mg  650 mg Oral Q6H PRN Antonieta Pert, MD   650 mg at 01/05/21 2111  . alum & mag hydroxide-simeth (MAALOX/MYLANTA) 200-200-20 MG/5ML suspension 30 mL  30 mL Oral Q4H PRN Antonieta Pert, MD      . Melene Muller ON 01/09/2021] cloNIDine (CATAPRES) tablet 0.1 mg  0.1 mg Oral QAC breakfast Melbourne Abts W, PA-C      . dicyclomine (BENTYL) tablet 20 mg  20 mg Oral Q6H PRN Jaclyn Shaggy, PA-C   20 mg at 01/06/21 1017  . FLUoxetine (PROZAC) capsule 20 mg  20 mg Oral Daily Laveda Abbe, NP   20 mg at 01/08/21 0750  . gabapentin (NEURONTIN) capsule 200 mg  200 mg Oral BID Laveda Abbe, NP   200 mg at 01/08/21 0750  . hydrOXYzine (ATARAX/VISTARIL) tablet 25 mg  25 mg Oral TID PRN Antonieta Pert, MD   25 mg at 01/08/21 0750  . loperamide (IMODIUM) capsule 2-4 mg  2-4 mg Oral PRN Jaclyn Shaggy, PA-C   2 mg at 01/06/21 5102  . magnesium hydroxide (MILK OF MAGNESIA) suspension 30 mL  30 mL Oral Daily PRN Antonieta Pert, MD      . melatonin tablet 3 mg  3 mg Oral QHS Laveda Abbe, NP   3 mg at 01/07/21 2114  . methocarbamol (ROBAXIN) tablet 500 mg  500 mg Oral Q8H PRN Jaclyn Shaggy, PA-C   500 mg at 01/05/21 1459  . naproxen (NAPROSYN) tablet 500 mg  500 mg Oral BID PRN Jaclyn Shaggy, PA-C   500 mg at 01/05/21 1458  . nicotine (NICODERM CQ - dosed in mg/24 hours) patch 21 mg  21 mg Transdermal Daily Antonieta Pert, MD   21 mg at 01/08/21 0750  . ondansetron (ZOFRAN-ODT) disintegrating tablet 4 mg  4 mg Oral Q6H PRN Jaclyn Shaggy, PA-C   4 mg at 01/06/21 1008  . traZODone (DESYREL) tablet 50 mg  50 mg Oral  QHS,MR X 1 Laveda Abbe, NP   50 mg at 01/07/21 2114    Lab Results: No results found for this or any previous visit (from the past 48 hour(s)).  Blood Alcohol level:  Lab Results  Component Value Date   ETH <10 01/02/2021   ETH <10 12/31/2020    Metabolic  Disorder Labs: Lab Results  Component Value Date   HGBA1C 4.5 (L) 01/04/2021   MPG 82.45 01/04/2021   No results found for: PROLACTIN Lab Results  Component Value Date   CHOL 142 01/04/2021   TRIG 146 01/04/2021   HDL 42 01/04/2021   CHOLHDL 3.4 01/04/2021   VLDL 29 01/04/2021   LDLCALC 71 01/04/2021    Physical Findings: AIMS: Facial and Oral Movements Muscles of Facial Expression: None, normal Lips and Perioral Area: None, normal Jaw: None, normal Tongue: None, normal,Extremity Movements Upper (arms, wrists, hands, fingers): None, normal Lower (legs, knees, ankles, toes): None, normal, Trunk Movements Neck, shoulders, hips: None, normal, Overall Severity Severity of abnormal movements (highest score from questions above): None, normal Incapacitation due to abnormal movements: None, normal Patient's awareness of abnormal movements (rate only patient's report): No Awareness, Dental Status Current problems with teeth and/or dentures?: No Does patient usually wear dentures?: No  CIWA:  CIWA-Ar Total: 4 COWS:  COWS Total Score: 2  Musculoskeletal: Strength & Muscle Tone: within normal limits Gait & Station: normal Patient leans: N/A  Psychiatric Specialty Exam: Physical Exam HENT:     Head: Normocephalic.  Pulmonary:     Effort: Pulmonary effort is normal.  Musculoskeletal:     Cervical back: Normal range of motion.  Neurological:     General: No focal deficit present.     Mental Status: She is alert and oriented to person, place, and time.  Psychiatric:        Mood and Affect: Mood is not depressed.        Behavior: Behavior is cooperative.        Thought Content: Thought content does not  include homicidal or suicidal ideation.     Review of Systems  Constitutional: Negative for chills and fever.  HENT: Negative for sore throat.   Respiratory: Negative for cough.   Cardiovascular: Negative for chest pain.  Gastrointestinal: Negative for constipation, diarrhea, nausea and vomiting.  Genitourinary: Negative for difficulty urinating.  Musculoskeletal: Negative for back pain.  Neurological: Negative for headaches.  Psychiatric/Behavioral: Positive for sleep disturbance. Negative for hallucinations, self-injury and suicidal ideas.    Blood pressure 101/77, pulse 92, temperature (!) 97.4 F (36.3 C), temperature source Oral, resp. rate 18, height 5\' 4"  (1.626 m), weight 59 kg, last menstrual period 01/03/2021, SpO2 100 %.Body mass index is 22.31 kg/m.  General Appearance: Casual and Fair hygiene  Eye Contact:  Good  Speech:  Clear and Coherent  Volume:  Normal  Mood:  Anxious.  Improving.  Less anxious.  Denies depression  Affect:  Congruent  Thought Process:  Coherent and Descriptions of Associations: Intact  Orientation:  Full (Time, Place, and Person)  Thought Content:  Logical and No hallucinations, delusions, paranoid ideation or referential thinking  Suicidal Thoughts:  No  Homicidal Thoughts:  No  Memory:  Immediate;   Fair Recent;   Fair Remote;   Fair  Judgement:  Fair  Insight:  Fair  Psychomotor Activity:  Fidgety  Concentration:  Concentration: Fair  Recall:  [employer]  Fund of Knowledge:  Fair  Language:  Good  Akathisia:  No  Handed:  Right  AIMS (if indicated):     Assets:  Communication Skills Desire for Improvement Resilience  ADL's:  Intact  Cognition:  WNL  Sleep:  Number of Hours: 6.75     Treatment Plan Summary: Daily contact with patient to assess and evaluate symptoms and progress in treatment and medication management  Continue every 15  minute observation status for safety of patient  Mood  -Continue fluoxetine 20 mg daily  Opioid  withdrawal management  -Continue COWS protocol  -We will give as needed medications as required and indicated for symptom management  -Continue gabapentin 200 mg twice daily  Insomnia  -Continue melatonin 3 mg at bedtime  -Continue trazodone 50 mg at bedtime as needed insomnia, may repeat x1  Participation in group therapy, recreation therapy, CBT, milieu therapy while on the unit  Discharge disposition plan is in progress.  Anticipate patient will leave when bed is available for her to go to substance abuse treatment program.  I certify that inpatient services furnished can reasonably be expected to improve the patient's condition.   Claudie Revering, MD 01/08/2021, 3:42 PM

## 2021-01-08 NOTE — Progress Notes (Signed)
D: Patient presents with anxious affect but is cooperative with assessment. Patient denies SI/HI at this time. Patient also denies AH/VH at this time. Patient contracts for safety.  A: Provided positive reinforcement and encouragement.  R: Patient cooperative and receptive to efforts. Patient remains safe on the unit.   01/07/21 2115  Psych Admission Type (Psych Patients Only)  Admission Status Voluntary  Psychosocial Assessment  Patient Complaints Anxiety  Eye Contact Fair  Facial Expression Animated;Anxious  Affect Appropriate to circumstance  Speech Logical/coherent;Rapid  Interaction Assertive  Motor Activity Fidgety  Appearance/Hygiene Unremarkable  Behavior Characteristics Cooperative;Appropriate to situation  Mood Anxious  Thought Process  Coherency WDL  Content WDL  Delusions None reported or observed  Perception WDL  Hallucination None reported or observed  Judgment Impaired  Confusion None  Danger to Self  Current suicidal ideation? Denies  Danger to Others  Danger to Others None reported or observed

## 2021-01-09 NOTE — Progress Notes (Signed)
Recreation Therapy Notes  Date: 01/09/2021 Time: 9:30a Location: [address] Dayroom  Group Topic: Stress Management  Goal Area(s) Addresses:  Patient will identify positive stress management techniques. Patient will identify benefits of using stress management post d/c.  Behavioral Response: Active, Appropriate  Intervention: Worksheet, Group brain storming  Activity :  Mind Map.  Patient was provided a blank template of a diagram with 32 blank boxes in a tiered system, branching from the center (similar to a bubble chart). LRT directed patients to label the middle of the diagram "Stress" and consider 8 different sources of stress in their day to day life. Pt were directed to record their stressors in the 2nd tier boxes closest to the center. Patients were to then come up with 3 effective techniques to address each identified area in the remaining boxes stemming from a particular stressor. Pts were encouraged to share ideas with one another and ask for suggestions of peers and Clinical research associate when stuck on a certain category of stress.  Education:  Stress Management, Discharge Planning.   Education Outcome: Acknowledges Education  Clinical Observations/Feedback: Pt was engaged and asked questions when needed. Pt shared personal struggles with the group and was supportive of others, offering suggestions. Pt was appropriate and on-task throughout activity.   Nicholos Johns Horner, LRT/CTRS Benito Mccreedy Horner 01/09/2021, 1:02 PM

## 2021-01-09 NOTE — [facility] Group Notes (Signed)
Type of Therapy and Topic:  Group Therapy:  Positive Affirmations   Participation Level:  Active  Description of Group: This group addressed positive affirmation toward self and others. Patients went around the room and identified two positive things about themselves and two positive things about a peer in the room. Patients reflected on how it felt to share something positive with others, to identify positive things about themselves, and to hear positive things from others. Patients were encouraged to have a daily reflection of positive characteristics or circumstances. Therapeutic Goals 1. Patient will verbalize two of their positive qualities 2. Patient will demonstrate empathy for others by stating two positive qualities about a peer in the group 3. Patient will verbalize their feelings when voicing positive self affirmations and when voicing positive affirmations of others 4. Patients will discuss the potential positive impact on their wellness/recovery of focusing on positive traits of self and others. Summary of Patient Progress:  The Pt attended group and accepted the worksheets that were provided.   Therapeutic Modalities Cognitive Behavioral Therapy Motivational Interviewing 

## 2021-01-09 NOTE — Tx Team (Signed)
Interdisciplinary Treatment and Diagnostic Plan Update  01/09/2021 Time of Session: 9:35am Sandra Garza MRN: [id number]  Principal Diagnosis: Bipolar 1 disorder (HCC)  Secondary Diagnoses: Principal Problem:   Bipolar 1 disorder (HCC) Active Problems:   Opioid use disorder, severe, dependence (HCC)   Methamphetamine use disorder, severe, dependence (HCC)   Current Medications:  Current Facility-Administered Medications  Medication Dose Route Frequency Provider Last Rate Last Admin  . acetaminophen (TYLENOL) tablet 650 mg  650 mg Oral Q6H PRN Antonieta Pert, MD   650 mg at 01/05/21 2111  . alum & mag hydroxide-simeth (MAALOX/MYLANTA) 200-200-20 MG/5ML suspension 30 mL  30 mL Oral Q4H PRN Antonieta Pert, MD      . cloNIDine (CATAPRES) tablet 0.1 mg  0.1 mg Oral QAC breakfast Melbourne Abts W, PA-C   0.1 mg at 01/09/21 0737  . FLUoxetine (PROZAC) capsule 20 mg  20 mg Oral Daily Laveda Abbe, NP   20 mg at 01/09/21 1062  . gabapentin (NEURONTIN) capsule 200 mg  200 mg Oral BID Laveda Abbe, NP   200 mg at 01/09/21 6948  . hydrOXYzine (ATARAX/VISTARIL) tablet 25 mg  25 mg Oral TID PRN Antonieta Pert, MD   25 mg at 01/09/21 5462  . magnesium hydroxide (MILK OF MAGNESIA) suspension 30 mL  30 mL Oral Daily PRN Antonieta Pert, MD      . melatonin tablet 3 mg  3 mg Oral QHS Laveda Abbe, NP   3 mg at 01/08/21 2205  . nicotine (NICODERM CQ - dosed in mg/24 hours) patch 21 mg  21 mg Transdermal Daily Antonieta Pert, MD   21 mg at 01/09/21 0804  . traZODone (DESYREL) tablet 50 mg  50 mg Oral QHS,MR X 1 Laveda Abbe, NP   50 mg at 01/09/21 0038   PTA Medications: Medications Prior to Admission  Medication Sig Dispense Refill Last Dose  . FLUoxetine (PROZAC) 20 MG capsule Take 20 mg by mouth daily.     Marland Kitchen gabapentin (NEURONTIN) 300 MG capsule Take 300 mg by mouth 2 (two) times daily.     Marland Kitchen lamoTRIgine (LAMICTAL) 100 MG tablet Take 100 mg by  mouth 2 (two) times daily.     . melatonin 3 MG TABS tablet Take 3 mg by mouth at bedtime as needed for sleep.       Patient Stressors: Paediatric nurse issue Loss of Custody of children  Substance abuse  Patient Strengths: Ability for insight Motivation for treatment/growth  Treatment Modalities: Medication Management, Group therapy, Case management,  1 to 1 session with clinician, Psychoeducation, Recreational therapy.   Physician Treatment Plan for Primary Diagnosis: Bipolar 1 disorder (HCC) Long Term Goal(s): Improvement in symptoms so as ready for discharge Improvement in symptoms so as ready for discharge   Short Term Goals: Ability to identify changes in lifestyle to reduce recurrence of condition will improve Ability to verbalize feelings will improve Ability to disclose and discuss suicidal ideas Ability to demonstrate self-control will improve Ability to identify and develop effective coping behaviors will improve Compliance with prescribed medications will improve Ability to identify triggers associated with substance abuse/mental health issues will improve  Medication Management: Evaluate patient's response, side effects, and tolerance of medication regimen.  Therapeutic Interventions: 1 to 1 sessions, Unit Group sessions and Medication administration.  Evaluation of Outcomes: Progressing  Physician Treatment Plan for Secondary Diagnosis: Principal Problem:   Bipolar 1 disorder (HCC) Active Problems:   Opioid use disorder, severe,  dependence (HCC)   Methamphetamine use disorder, severe, dependence (HCC)  Long Term Goal(s): Improvement in symptoms so as ready for discharge Improvement in symptoms so as ready for discharge   Short Term Goals: Ability to identify changes in lifestyle to reduce recurrence of condition will improve Ability to verbalize feelings will improve Ability to disclose and discuss suicidal ideas Ability to demonstrate  self-control will improve Ability to identify and develop effective coping behaviors will improve Compliance with prescribed medications will improve Ability to identify triggers associated with substance abuse/mental health issues will improve     Medication Management: Evaluate patient's response, side effects, and tolerance of medication regimen.  Therapeutic Interventions: 1 to 1 sessions, Unit Group sessions and Medication administration.  Evaluation of Outcomes: Progressing   RN Treatment Plan for Primary Diagnosis: Bipolar 1 disorder (HCC) Long Term Goal(s): Knowledge of disease and therapeutic regimen to maintain health will improve  Short Term Goals: Ability to remain free from injury will improve, Ability to verbalize frustration and anger appropriately will improve, Ability to identify and develop effective coping behaviors will improve and Compliance with prescribed medications will improve  Medication Management: RN will administer medications as ordered by provider, will assess and evaluate patient's response and provide education to patient for prescribed medication. RN will report any adverse and/or side effects to prescribing provider.  Therapeutic Interventions: 1 on 1 counseling sessions, Psychoeducation, Medication administration, Evaluate responses to treatment, Monitor vital signs and CBGs as ordered, Perform/monitor CIWA, COWS, AIMS and Fall Risk screenings as ordered, Perform wound care treatments as ordered.  Evaluation of Outcomes: Progressing   LCSW Treatment Plan for Primary Diagnosis: Bipolar 1 disorder (HCC) Long Term Goal(s): Safe transition to appropriate next level of care at discharge, Engage patient in therapeutic group addressing interpersonal concerns.  Short Term Goals: Engage patient in aftercare planning with referrals and resources, Increase social support, Identify triggers associated with mental health/substance abuse issues and Increase skills  for wellness and recovery  Therapeutic Interventions: Assess for all discharge needs, 1 to 1 time with Social worker, Explore available resources and support systems, Assess for adequacy in community support network, Educate family and significant other(s) on suicide prevention, Complete Psychosocial Assessment, Interpersonal group therapy.  Evaluation of Outcomes: Progressing   Progress in Treatment: Attending groups: Yes. Participating in groups: Yes. Taking medication as prescribed: Yes. Toleration medication: Yes. Family/Significant other contact made: Yes, individual(s) contacted:  with mother Patient understands diagnosis: Yes. Discussing patient identified problems/goals with staff: Yes. Medical problems stabilized or resolved: Yes. Denies suicidal/homicidal ideation: Yes. Issues/concerns per patient self-inventory: No.   New problem(s) identified: No, Describe:  none  New Short Term/Long Term Goal(s): detox, medication management for mood stabilization; elimination of SI thoughts; development of comprehensive mental wellness/sobriety plan   Patient Goals:  "To get medicine, housing, rehab, the streets are bad for me"  Discharge Plan or Barriers: Patient is interested in residential substance use treatment post discharge.  Reason for Continuation of Hospitalization: Medication stabilization  Estimated Length of Stay: 3-5 days  Attendees: Patient: 01/07/2021   Physician:  01/07/2021   Nursing:  01/07/2021   RN Care Manager: 01/07/2021   Social Worker: Ruthann Cancer, LCSW 01/07/2021   Recreational Therapist:  01/07/2021   Other:  01/07/2021   Other:  01/07/2021   Other: 01/07/2021       Scribe for Treatment Team: Otelia Santee, LCSW 01/09/2021 10:39 AM

## 2021-01-09 NOTE — Progress Notes (Addendum)
   01/09/21 1200  Psych Admission Type (Psych Patients Only)  Admission Status Voluntary  Psychosocial Assessment  Patient Complaints Anxiety;Depression  Eye Contact Fair  Facial Expression Animated;Anxious  Affect Appropriate to circumstance  Speech Logical/coherent;Rapid  Interaction Assertive  Motor Activity Fidgety  Appearance/Hygiene Unremarkable  Behavior Characteristics Cooperative  Mood Depressed;Anxious  Aggressive Behavior  Effect No apparent injury  Thought Process  Coherency WDL  Content WDL  Delusions None reported or observed  Perception WDL  Hallucination None reported or observed  Judgment Impaired  Confusion None  Danger to Self  Current suicidal ideation? Denies  Danger to Others  Danger to Others None reported or observed    D. Pt presents as anxious and fidgety, friendly during interactions. Pt continues to endorse anxiety a 10/10, and rates her depression and hopelessness 6/10. Pt observed in the milieu interacting well with peers and attending groups. Pt currently denies SI/HI and AVH  A. Labs and vitals monitored. Pt administered scheduled meds and given Vistaril prn for anxiety. Pt supported emotionally and encouraged to express concerns and ask questions.   R. Pt remains safe with 15 minute checks. Will continue POC.

## 2021-01-09 NOTE — Progress Notes (Signed)
[place] NOVEL CORONAVIRUS (COVID-19) DAILY CHECK-OFF SYMPTOMS - answer yes or no to each - every day NO YES  Have you had a fever in the past 24 hours?  . Fever (Temp > 37.80C / 100F) X   Have you had any of these symptoms in the past 24 hours? . New Cough .  Sore Throat  .  Shortness of Breath .  Difficulty Breathing .  Unexplained Body Aches   X   Have you had any one of these symptoms in the past 24 hours not related to allergies?   . Runny Nose .  Nasal Congestion .  Sneezing   X   If you have had runny nose, nasal congestion, sneezing in the past 24 hours, has it worsened?  X   EXPOSURES - check yes or no X   Have you traveled outside the state in the past 14 days?  X   Have you been in contact with someone with a confirmed diagnosis of COVID-19 or PUI in the past 14 days without wearing appropriate PPE?  X   Have you been living in the same home as a person with confirmed diagnosis of COVID-19 or a PUI (household contact)?    X   Have you been diagnosed with COVID-19?    X              What to do next: Answered NO to all: Answered YES to anything:   Proceed with unit schedule Follow the [facility] Inpatient Flowsheet.   

## 2021-01-09 NOTE — Progress Notes (Signed)
   01/09/21 2331  Psych Admission Type (Psych Patients Only)  Admission Status Voluntary  Psychosocial Assessment  Patient Complaints Anxiety;Depression;Hyperactivity  Eye Contact Fair  Facial Expression Animated;Anxious  Affect Appropriate to circumstance  Speech Logical/coherent;Rapid  Interaction Assertive  Motor Activity Fidgety;Hyperactive  Appearance/Hygiene Unremarkable  Behavior Characteristics Cooperative;Appropriate to situation  Mood Depressed;Anxious;Pleasant  Thought Process  Coherency WDL  Content WDL  Delusions None reported or observed  Perception WDL  Hallucination None reported or observed  Judgment Impaired  Confusion None  Danger to Self  Current suicidal ideation? Denies  Danger to Others  Danger to Others None reported or observed   Pt seen in hallway. Pt denies SI, HI, AVH and pain. Pt rates anxiety 8/10 and depression 5/10. Pt very hyperactive. States she had a panic attack earlier. "My boyfriend is in jail and he's probably going to do some time. I've been thinking about that today." Pt encouraged to think about herself and what she needs to achieve a balance and maintain her mental health. Pt has c/o diarrhea. Discussed that it may be related to her increased anxiety today.

## 2021-01-09 NOTE — Progress Notes (Signed)
[name] MD Progress Note  01/09/2021 3:56 PM Sandra Garza  MRN:  [id number] Subjective: Patient was seen and interviewed today in the patient's room.  Chart was reviewed.  The patient was discussed in detail in treatment team rounds this morning with other members of the treatment team.  The patient is a 35 year old Caucasian female with a history of mental health issues including bipolar disorder type I as well as opioid use disorder and methamphetamine use disorder.  She was admitted for opioid detoxification and mood stabilization treatments.  Urine drug screen was positive on admission for amphetamines, opioids and THC.  On interview today, the patient reports that her mood is "good".  She does acknowledge still feeling depressed which she rates as 6 out of 10 in intensity but reports she feels much less depressed than on admission.  She denies suicidal ideation or passive thoughts of death.  She denies any thoughts of harming or killing others.  She denies auditory or visual hallucinations.  She denies paranoid ideation.  The patient denies any problems with her medication or medication side effects.  She reports that she is sleeping better.  She still has some middle insomnia but is able to return to sleep.  Appetite is good.  The patient states that she still gets anxious at times which is accompanied by sharp stabbing chest pain which goes away when she columns her when she takes Vistaril for anxiety.  She denies other problems.  She is waiting for a bed so that she can discharge to residential rehab facility.  Principal Problem: Bipolar 1 disorder (HCC) Diagnosis: Principal Problem:   Bipolar 1 disorder (HCC) Active Problems:   Opioid use disorder, severe, dependence (HCC)   Methamphetamine use disorder, severe, dependence (HCC)  Total Time spent with patient: 20 minutes  Past Psychiatric History: See H&P  Past Medical History:  Past Medical History:  Diagnosis Date  . ADHD   . Anxiety    . Asthma   . Bipolar 1 disorder, depressed (HCC)   . Drug abuse (HCC)   . Hypercholesteremia   . Pinched nerve in neck    C 6-7    Past Surgical History:  Procedure Laterality Date  . ADENOIDECTOMY    . CESAREAN SECTION    . CYSTECTOMY     on overy  . TONSILLECTOMY    . TUBAL LIGATION    . WISDOM TOOTH EXTRACTION     Family History:  Family History  Problem Relation Age of Onset  . Heart disease Maternal Grandmother    Family Psychiatric  History: See H&P Social History:  Social History   Substance and Sexual Activity  Alcohol Use Not Currently     Social History   Substance and Sexual Activity  Drug Use Yes  . Types: Marijuana, Cocaine, Methamphetamines    Social History   Socioeconomic History  . Marital status: Single    Spouse name: Not on file  . Number of children: Not on file  . Years of education: Not on file  . Highest education level: Not on file  Occupational History  . Not on file  Tobacco Use  . Smoking status: Current Every Day Smoker    Packs/day: 0.75    Types: Cigarettes    Start date: 12/26/2006  . Smokeless tobacco: Never Used  Vaping Use  . Vaping Use: Never used  Substance and Sexual Activity  . Alcohol use: Not Currently  . Drug use: Yes    Types: Marijuana, Cocaine, Methamphetamines  .  Sexual activity: Yes    Birth control/protection: Surgical  Other Topics Concern  . Not on file  Social History Narrative  . Not on file   Social Determinants of Health   Financial Resource Strain: Not on file  Food Insecurity: Not on file  Transportation Needs: Not on file  Physical Activity: Not on file  Stress: Not on file  Social Connections: Not on file   Additional Social History:                         Sleep: Good  Appetite:  Good  Current Medications: Current Facility-Administered Medications  Medication Dose Route Frequency Provider Last Rate Last Admin  . acetaminophen (TYLENOL) tablet 650 mg  650 mg Oral Q6H  PRN Antonieta Pertlary, Greg Lawson, MD   650 mg at 01/05/21 2111  . alum & mag hydroxide-simeth (MAALOX/MYLANTA) 200-200-20 MG/5ML suspension 30 mL  30 mL Oral Q4H PRN Antonieta Pertlary, Greg Lawson, MD      . cloNIDine (CATAPRES) tablet 0.1 mg  0.1 mg Oral QAC breakfast Melbourne Abtsaylor, Cody W, PA-C   0.1 mg at 01/09/21 [id number]  . FLUoxetine (PROZAC) capsule 20 mg  20 mg Oral Daily Laveda AbbeParks, Laurie Britton, NP   20 mg at 01/09/21 [id number]  . gabapentin (NEURONTIN) capsule 200 mg  200 mg Oral BID Laveda AbbeParks, Laurie Britton, NP   200 mg at 01/09/21 [id number]  . hydrOXYzine (ATARAX/VISTARIL) tablet 25 mg  25 mg Oral TID PRN Antonieta Pertlary, Greg Lawson, MD   25 mg at 01/09/21 1403  . magnesium hydroxide (MILK OF MAGNESIA) suspension 30 mL  30 mL Oral Daily PRN Antonieta Pertlary, Greg Lawson, MD      . melatonin tablet 3 mg  3 mg Oral QHS Laveda AbbeParks, Laurie Britton, NP   3 mg at 01/08/21 2205  . nicotine (NICODERM CQ - dosed in mg/24 hours) patch 21 mg  21 mg Transdermal Daily Antonieta Pertlary, Greg Lawson, MD   21 mg at 01/09/21 0804  . traZODone (DESYREL) tablet 50 mg  50 mg Oral QHS,MR X 1 Laveda AbbeParks, Laurie Britton, NP   50 mg at 01/09/21 0038    Lab Results: No results found for this or any previous visit (from the past 48 hour(s)).  Blood Alcohol level:  Lab Results  Component Value Date   ETH <10 01/02/2021   ETH <10 12/31/2020    Metabolic Disorder Labs: Lab Results  Component Value Date   HGBA1C 4.5 (L) 01/04/2021   MPG 82.45 01/04/2021   No results found for: PROLACTIN Lab Results  Component Value Date   CHOL 142 01/04/2021   TRIG 146 01/04/2021   HDL 42 01/04/2021   CHOLHDL 3.4 01/04/2021   VLDL 29 01/04/2021   LDLCALC 71 01/04/2021    Physical Findings: AIMS: Facial and Oral Movements Muscles of Facial Expression: None, normal Lips and Perioral Area: None, normal Jaw: None, normal Tongue: None, normal,Extremity Movements Upper (arms, wrists, hands, fingers): None, normal Lower (legs, knees, ankles, toes): None, normal, Trunk Movements Neck, shoulders,  hips: None, normal, Overall Severity Severity of abnormal movements (highest score from questions above): None, normal Incapacitation due to abnormal movements: None, normal Patient's awareness of abnormal movements (rate only patient's report): No Awareness, Dental Status Current problems with teeth and/or dentures?: No Does patient usually wear dentures?: No  CIWA:  CIWA-Ar Total: 4 COWS:  COWS Total Score: 2  Musculoskeletal: Strength & Muscle Tone: within normal limits Gait & Station: normal Patient leans: N/A  Psychiatric Specialty  Exam: Physical Exam Vitals and nursing note reviewed.  HENT:     Head: Normocephalic and atraumatic.  Pulmonary:     Effort: Pulmonary effort is normal.  Musculoskeletal:        General: Normal range of motion.     Cervical back: Normal range of motion.  Neurological:     General: No focal deficit present.     Mental Status: She is alert and oriented to person, place, and time.  Psychiatric:        Attention and Perception: She does not perceive auditory or visual hallucinations.        Mood and Affect: Mood is anxious and depressed. Affect is not angry.        Speech: Speech normal.        Behavior: Behavior normal.        Thought Content: Thought content normal. Thought content is not paranoid or delusional. Thought content does not include homicidal or suicidal ideation.        Cognition and Memory: Cognition normal.        Judgment: Judgment normal.     Review of Systems  Constitutional: Negative for chills, diaphoresis, fatigue and fever.  HENT: Negative for sneezing.   Respiratory: Negative for cough and shortness of breath.   Cardiovascular: Positive for chest pain.       Chronic chest pain associated with anxiety; sharp stabbing in quality  Gastrointestinal: Negative for constipation, diarrhea, nausea and vomiting.  Genitourinary: Negative for difficulty urinating.  Musculoskeletal: Negative for back pain.  Neurological: Negative  for dizziness.  Psychiatric/Behavioral: Negative for agitation, hallucinations, self-injury, sleep disturbance and suicidal ideas. The patient is nervous/anxious.     Blood pressure 119/89, pulse 83, temperature 97.7 F (36.5 C), temperature source Oral, resp. rate 18, height 5\' 4"  (1.626 m), weight 59 kg, last menstrual period 01/03/2021, SpO2 98 %.Body mass index is 22.31 kg/m.  General Appearance: Casual and Adequate hygiene  Eye Contact:  Good  Speech:  Clear and Coherent and Normal Rate  Volume:  Normal  Mood:  Anxious; less depressed  Affect:  Appropriate and Congruent  Thought Process:  Coherent and Goal Directed  Orientation:  Full (Time, Place, and Person)  Thought Content:  Logical and No paranoid ideation or delusions.  No other delusions.  No referential thinking.  Suicidal Thoughts:  No  Homicidal Thoughts:  No  Memory:  Immediate;   Good Recent;   Good Remote;   Good  Judgement:  Fair  Insight:  Fair  Psychomotor Activity:  Normal  Concentration:  Concentration: Fair  Recall:  Fair  Fund of Knowledge:  Fair  Language:  Good  Akathisia:  No  Handed:  Right  AIMS (if indicated):     Assets:  Communication Skills Desire for Improvement Leisure Time Resilience Talents/Skills  ADL's:  Intact  Cognition:  WNL  Sleep:  Number of Hours: 5.75     Treatment Plan Summary: Daily contact with patient to assess and evaluate symptoms and progress in treatment and medication management  Continue every 15-minute observation status for safety of patient  Medication management Mood  -Continue fluoxetine 20 mg daily for mood and anxiety symptoms.  Opioid withdrawal management  -Continue COWS protocol with plan to discontinue at the end of the day today  -Give as needed medications as required and indicated for symptom management  -Continue gabapentin 200 mg twice daily  Insomnia  -Continue melatonin 3 mg at bedtime  -Continue trazodone 50 mg at bedtime as  needed for  insomnia, may repeat x1  -Courage participation in group therapy, recreation therapy, milieu therapy, CBT while on the unit  Discharge planning is in progress.  Anticipate discharge of patient when bed is available for her to go to substance abuse treatment program.  Patient is currently homeless and has nowhere else to go.  I certify that inpatient services furnished can reasonably be expected to improve the patient's condition   Claudie Revering, MD 01/09/2021, 3:56 PM

## 2021-01-09 NOTE — [facility] Group Notes (Signed)
Adult Psychoeducational Group Note  Date:  01/09/2021 Time:  10:12 AM  Group Topic/Focus:  Goals Group:   The focus of this group is to help patients establish daily goals to achieve during treatment and discuss how the patient can incorporate goal setting into their daily lives to aide in recovery.  Participation Level:  Active  Participation Quality:  Appropriate  Affect:  Appropriate  Cognitive:  Appropriate  Insight: Appropriate  Engagement in Group:  Engaged  Modes of Intervention:  Discussion  Additional Comments:  Patient attended morning group and participated.   Delicia W Forkpah 01/09/2021, 10:12 AM

## 2021-01-10 DIAGNOSIS — F1520 Other stimulant dependence, uncomplicated: Secondary | ICD-10-CM | POA: Diagnosis not present

## 2021-01-10 DIAGNOSIS — F319 Bipolar disorder, unspecified: Secondary | ICD-10-CM | POA: Diagnosis not present

## 2021-01-10 DIAGNOSIS — F1120 Opioid dependence, uncomplicated: Secondary | ICD-10-CM | POA: Diagnosis not present

## 2021-01-10 MED ORDER — ONDANSETRON 4 MG PO TBDP
4.0000 mg | ORAL_TABLET | Freq: Four times a day (QID) | ORAL | Status: DC | PRN
  Administered 2021-01-10: 4 mg via ORAL
  Filled 2021-01-10 (×2): qty 1

## 2021-01-10 MED ORDER — DICYCLOMINE HCL 20 MG PO TABS
20.0000 mg | ORAL_TABLET | Freq: Four times a day (QID) | ORAL | Status: DC | PRN
  Administered 2021-01-10: 20 mg via ORAL
  Filled 2021-01-10 (×2): qty 1

## 2021-01-10 NOTE — Progress Notes (Signed)
[facility]  01/10/2021 1:39 PM Sandra Maskndrea Frisch  MRN:  [id number] Subjective: "I feel better today, still anxious but better"  In brief: Sandra Maskndrea Devers is a 35 year old Caucasian female with a psychiatric history significant for bipolar disorder type I, opioid use disorder and methamphetamine use disorder.  She was admitted for opioid detoxification and mood stabilization treatments.  Urine drug screen was positive on admission for amphetamines, opioids and THC.  Evaluation on the unit: Patient was seen, chart reviewed and case discussed with the treatment team. Patient stated that her mood is "good" and she feels better today.  She does acknowledge still feeling depressed which she rates as 4 or 5 out of 10 in intensity but reports she feels much less depressed than on admission.  She denies suicidal ideation or passive thoughts of death.  She denies HI/AVH, paranoia or delusions.  She stated she did not sleep well last night due to stomach cramps but once she had a bowel movement things improved. She stated her issue is not falling asleep, it is staying asleep. Encouraged patient to ask for repeat dose of Trazodone to improve her sleep. The patient denies any problems with her medication or medication side effects. She reported her appetite is good.  The patient states that she still gets anxious at times but she is able to calm down when she takes Vistaril for anxiety.  She stated she has been anxious all her life. She denies other problems.  She is waiting for a substance abuse rehab bed. She is attending group activities and interacting appropriately in the therapeutic milieu. Will continue to offer support and encouragement.   Principal Problem: Bipolar 1 disorder (HCC) Diagnosis: Principal Problem:   Bipolar 1 disorder (HCC) Active Problems:   Chronic pain syndrome   Opioid use disorder, severe, dependence (HCC)   Methamphetamine use disorder, severe, dependence (HCC)  Total Time spent  with patient: 20 minutes  Past Psychiatric History: See H&P  Past Medical History:  Past Medical History:  Diagnosis Date  . ADHD   . Anxiety   . Asthma   . Bipolar 1 disorder, depressed (HCC)   . Drug abuse (HCC)   . Hypercholesteremia   . Pinched nerve in neck    C 6-7    Past Surgical History:  Procedure Laterality Date  . ADENOIDECTOMY    . CESAREAN SECTION    . CYSTECTOMY     on overy  . TONSILLECTOMY    . TUBAL LIGATION    . WISDOM TOOTH EXTRACTION     Family History:  Family History  Problem Relation Age of Onset  . Heart disease Maternal Grandmother    Family Psychiatric  History: See H&P Social History:  Social History   Substance and Sexual Activity  Alcohol Use Not Currently     Social History   Substance and Sexual Activity  Drug Use Yes  . Types: Marijuana, Cocaine, Methamphetamines    Social History   Socioeconomic History  . Marital status: Single    Spouse name: Not on file  . Number of children: Not on file  . Years of education: Not on file  . Highest education level: Not on file  Occupational History  . Not on file  Tobacco Use  . Smoking status: Current Every Day Smoker    Packs/day: 0.75    Types: Cigarettes    Start date: 12/26/2006  . Smokeless tobacco: Never Used  Vaping Use  . Vaping Use: Never used  Substance and Sexual Activity  . Alcohol use: Not Currently  . Drug use: Yes    Types: Marijuana, Cocaine, Methamphetamines  . Sexual activity: Yes    Birth control/protection: Surgical  Other Topics Concern  . Not on file  Social History Narrative  . Not on file   Social Determinants of Health   Financial Resource Strain: Not on file  Food Insecurity: Not on file  Transportation Needs: Not on file  Physical Activity: Not on file  Stress: Not on file  Social Connections: Not on file   Additional Social History:   Sleep: Fair  Appetite:  Good  Current Medications: Current Facility-Administered Medications   Medication Dose Route Frequency Provider Last Rate Last Admin  . acetaminophen (TYLENOL) tablet 650 mg  650 mg Oral Q6H PRN Antonieta Pert, MD   650 mg at 01/05/21 2111  . alum & mag hydroxide-simeth (MAALOX/MYLANTA) 200-200-20 MG/5ML suspension 30 mL  30 mL Oral Q4H PRN Antonieta Pert, MD      . dicyclomine (BENTYL) tablet 20 mg  20 mg Oral Q6H PRN Jaclyn Shaggy, PA-C   20 mg at 01/10/21 0351  . FLUoxetine (PROZAC) capsule 20 mg  20 mg Oral Daily Laveda Abbe, NP   20 mg at 01/10/21 0825  . gabapentin (NEURONTIN) capsule 200 mg  200 mg Oral BID Laveda Abbe, NP   200 mg at 01/10/21 0825  . hydrOXYzine (ATARAX/VISTARIL) tablet 25 mg  25 mg Oral TID PRN Antonieta Pert, MD   25 mg at 01/09/21 2156  . magnesium hydroxide (MILK OF MAGNESIA) suspension 30 mL  30 mL Oral Daily PRN Antonieta Pert, MD      . melatonin tablet 3 mg  3 mg Oral QHS Laveda Abbe, NP   3 mg at 01/09/21 2156  . nicotine (NICODERM CQ - dosed in mg/24 hours) patch 21 mg  21 mg Transdermal Daily Antonieta Pert, MD   21 mg at 01/10/21 1610  . ondansetron (ZOFRAN-ODT) disintegrating tablet 4 mg  4 mg Oral Q6H PRN Jaclyn Shaggy, PA-C   4 mg at 01/10/21 0351  . traZODone (DESYREL) tablet 50 mg  50 mg Oral QHS,MR X 1 Laveda Abbe, NP   50 mg at 01/09/21 2156    Lab Results: No results found for this or any previous visit (from the past 48 hour(s)).  Blood Alcohol level:  Lab Results  Component Value Date   ETH <10 01/02/2021   ETH <10 12/31/2020    Metabolic Disorder Labs: Lab Results  Component Value Date   HGBA1C 4.5 (L) 01/04/2021   MPG 82.45 01/04/2021   No results found for: PROLACTIN Lab Results  Component Value Date   CHOL 142 01/04/2021   TRIG 146 01/04/2021   HDL 42 01/04/2021   CHOLHDL 3.4 01/04/2021   VLDL 29 01/04/2021   LDLCALC 71 01/04/2021    Physical Findings: AIMS: Facial and Oral Movements Muscles of Facial Expression: None, normal Lips  and Perioral Area: None, normal Jaw: None, normal Tongue: None, normal,Extremity Movements Upper (arms, wrists, hands, fingers): None, normal Lower (legs, knees, ankles, toes): None, normal, Trunk Movements Neck, shoulders, hips: None, normal, Overall Severity Severity of abnormal movements (highest score from questions above): None, normal Incapacitation due to abnormal movements: None, normal Patient's awareness of abnormal movements (rate only patient's report): No Awareness, Dental Status Current problems with teeth and/or dentures?: No Does patient usually wear dentures?: No  CIWA:  CIWA-Ar  Total: 4 COWS:  COWS Total Score: 2  Musculoskeletal: Strength & Muscle Tone: within normal limits Gait & Station: normal Patient leans: N/A  Psychiatric Specialty Exam: Physical Exam Vitals and nursing note reviewed.  HENT:     Head: Normocephalic and atraumatic.  Pulmonary:     Effort: Pulmonary effort is normal.  Musculoskeletal:        General: Normal range of motion.     Cervical back: Normal range of motion.  Neurological:     General: No focal deficit present.     Mental Status: She is alert and oriented to person, place, and time.  Psychiatric:        Attention and Perception: She does not perceive auditory or visual hallucinations.        Mood and Affect: Mood is anxious and depressed. Affect is not angry.        Speech: Speech normal.        Behavior: Behavior normal.        Thought Content: Thought content normal. Thought content is not paranoid or delusional. Thought content does not include homicidal or suicidal ideation.        Cognition and Memory: Cognition normal.        Judgment: Judgment normal.     Review of Systems  Constitutional: Negative for chills, diaphoresis, fatigue and fever.  HENT: Negative for sneezing.   Respiratory: Negative for cough and shortness of breath.   Cardiovascular: Positive for chest pain.       Chronic chest pain associated with  anxiety; sharp stabbing in quality  Gastrointestinal: Negative for constipation, diarrhea, nausea and vomiting.  Genitourinary: Negative for difficulty urinating.  Musculoskeletal: Negative for back pain.  Neurological: Negative for dizziness.  Psychiatric/Behavioral: Negative for agitation, hallucinations, self-injury, sleep disturbance and suicidal ideas. The patient is nervous/anxious.     Blood pressure 112/79, pulse 91, temperature 98.1 F (36.7 C), temperature source Oral, resp. rate 18, height 5\' 4"  (1.626 m), weight 59.6 kg, last menstrual period 01/03/2021, SpO2 91 %.Body mass index is 22.57 kg/m.  General Appearance: Casual and Adequate hygiene  Eye Contact:  Good  Speech:  Clear and Coherent and Normal Rate  Volume:  Normal  Mood:  Anxious; less depressed  Affect:  Appropriate and Congruent  Thought Process:  Coherent and Goal Directed  Orientation:  Full (Time, Place, and Person)  Thought Content:  Logical and No paranoid ideation or delusions.  No other delusions.  No referential thinking.  Suicidal Thoughts:  No  Homicidal Thoughts:  No  Memory:  Immediate;   Good Recent;   Good Remote;   Good  Judgement:  Fair  Insight:  Fair  Psychomotor Activity:  Normal  Concentration:  Concentration: Fair  Recall:  Fair  Fund of Knowledge:  Fair  Language:  Good  Akathisia:  No  Handed:  Right  AIMS (if indicated):     Assets:  Communication Skills Desire for Improvement Leisure Time Resilience Talents/Skills  ADL's:  Intact  Cognition:  WNL  Sleep:  Number of Hours: 2.5     Treatment Plan Summary: Daily contact with patient to assess and evaluate symptoms and progress in treatment and medication management  Continue every 15-minute observation status for safety of patient  Encourage participation in group therapy, recreation therapy, milieu therapy, CBT while on the unit  Medication management:   Mood/Depression  -Continue fluoxetine 20 mg daily for mood and  anxiety symptoms.  Opioid withdrawal management -Continue COWS protocol -Catapres has discontinued  per protocol parameters.  -Give as needed medications as  indicated for symptom management  -Continue gabapentin 200 mg twice daily  Insomnia  -Continue melatonin 3 mg at bedtime  -Continue trazodone 50 mg at bedtime as needed for insomnia, may repeat x1   Discharge planning is in progress. Patient is waiting for a substance abuse treatment bed.   Laveda Abbe, NP 01/10/2021, 1:39 PM

## 2021-01-10 NOTE — [facility] Counselor (Signed)
CSW contacted [facility] and pt has still been accepted but there are no bed available at this time.  CSW contacted [facility] and the nurse stated she would contact CSW back with bed information.  Fredirick Lathe, LCSWA Clinicial Social Worker [employer] [facility]

## 2021-01-10 NOTE — Progress Notes (Signed)
   01/10/21 2220  COVID-19 Daily Checkoff  Have you had a fever (temp > 37.80C/100F)  in the past 24 hours?  No  If you have had runny nose, nasal congestion, sneezing in the past 24 hours, has it worsened? No  COVID-19 EXPOSURE  Have you traveled outside the state in the past 14 days? No  Have you been in contact with someone with a confirmed diagnosis of COVID-19 or PUI in the past 14 days without wearing appropriate PPE? No  Have you been living in the same home as a person with confirmed diagnosis of COVID-19 or a PUI (household contact)? No  Have you been diagnosed with COVID-19? No

## 2021-01-10 NOTE — Progress Notes (Signed)
Pt again in the bathroom vomiting. Eggy yellow vomitus indicative of dry heaves. Pt sitting in bathroom floor. Says she often gets sick like this when coming off heroin. Pt c/o stomach cramping as well. Pt given gatorade and ice chips to stay hydrated. Provider notified. Orders placed for Bentyl and Zofran. Pt agreeable to take medications to help alleviate symptoms. Will continue to monitor.

## 2021-01-10 NOTE — Progress Notes (Signed)
[facility] Group Notes:  (Nursing/MHT/Case Management/Adjunct)  Date:  01/10/2021  Time:  2015 Type of Therapy:  wrap up group  Participation Level:  Active  Participation Quality:  Appropriate and Sharing  Affect:  Excited  Cognitive:  Alert  Insight:  Improving  Engagement in Group:  Distracting and Engaged  Modes of Intervention:  Clarification, Education and Support  Summary of Progress/Problems: Positive thinking and positive change were discussed.   Johann Capers S 01/10/2021, 9:02 PM

## 2021-01-10 NOTE — Progress Notes (Signed)
Pt could be heard vomiting. Went to room and pt laying on her bed. Pt said she is okay. "I just have to work through it that's all." Pt refused ginger ale and any other assistance at this time. Assured pt she just needs to let this writer know if she needs something. Pt verbalized understanding.

## 2021-01-10 NOTE — Progress Notes (Signed)
   01/10/21 0820  Vital Signs  Temp 98.1 F (36.7 C)  Temp Source Oral  Pulse Rate 91  Pulse Rate Source Dinamap  Resp 18  BP 112/79  BP Location Left Arm  BP Method Automatic  Patient Position (if appropriate) Sitting  Oxygen Therapy  SpO2 91 %  Height and Weight  Weight 59.6 kg  BMI (Calculated) 22.56   D: Patient denies SI/HI/AVH. Patient rated anxiety 8/10 and depression 5/10. Patient was weighed and had no significant weight loss and has a slight 0.7 weight gain since admission. Pt. Out in open areas and social with  Peers and staff.  A:  Patient took scheduled medicine.  Support and encouragement provided Routine safety checks conducted every 15 minutes. Patient  Informed to notify staff with any concerns.   R: Safety maintained.

## 2021-01-10 NOTE — Progress Notes (Signed)
   01/10/21 2223  Psych Admission Type (Psych Patients Only)  Admission Status Voluntary  Psychosocial Assessment  Patient Complaints Anxiety;Substance abuse  Eye Contact Fair  Facial Expression Animated  Affect Appropriate to circumstance  Speech Logical/coherent  Interaction Assertive  Motor Activity Fidgety  Appearance/Hygiene Unremarkable  Behavior Characteristics Appropriate to situation  Mood Pleasant;Anxious  Thought Process  Coherency WDL  Content WDL  Delusions None reported or observed  Perception WDL  Hallucination None reported or observed  Judgment Impaired  Confusion None  Danger to Self  Current suicidal ideation? Denies  Danger to Others  Danger to Others None reported or observed

## 2021-01-10 NOTE — Progress Notes (Signed)
Adult Psychoeducational Group Note  Date:  01/10/2021 Time:  10:34 AM  Group Topic/Focus:  Dimensions of Wellness:   The focus of this group is to introduce the topic of wellness and discuss the role each dimension of wellness plays in total health.  Participation Level:  Active  Participation Quality:  Appropriate  Affect:  Appropriate  Cognitive:  Appropriate  Insight: Appropriate  Engagement in Group:  Engaged  Modes of Intervention:  Discussion  Additional Comments:   Patient attended morning orientation and Wellness group and participated.  Sandra Garza W [name] 01/10/2021, 10:34 AM

## 2021-01-11 DIAGNOSIS — F1520 Other stimulant dependence, uncomplicated: Secondary | ICD-10-CM | POA: Diagnosis not present

## 2021-01-11 DIAGNOSIS — F1120 Opioid dependence, uncomplicated: Secondary | ICD-10-CM | POA: Diagnosis not present

## 2021-01-11 DIAGNOSIS — F319 Bipolar disorder, unspecified: Secondary | ICD-10-CM | POA: Diagnosis not present

## 2021-01-11 MED ORDER — HYDROXYZINE HCL 25 MG PO TABS
25.0000 mg | ORAL_TABLET | Freq: Three times a day (TID) | ORAL | Status: DC | PRN
  Administered 2021-01-11 – 2021-01-12 (×2): 25 mg via ORAL
  Filled 2021-01-11: qty 1
  Filled 2021-01-11: qty 30
  Filled 2021-01-11: qty 1

## 2021-01-11 MED ORDER — GABAPENTIN 100 MG PO CAPS
200.0000 mg | ORAL_CAPSULE | Freq: Three times a day (TID) | ORAL | Status: DC
  Administered 2021-01-11 – 2021-01-12 (×4): 200 mg via ORAL
  Filled 2021-01-11 (×9): qty 2

## 2021-01-11 NOTE — Progress Notes (Signed)
[facility] Group Notes:  (Nursing/MHT/Case Management/Adjunct)  Date:  01/11/2021  Time: 2030 Type of Therapy:  wrap up group  Participation Level:  Active  Participation Quality:  Appropriate, Attentive, Sharing and Supportive  Affect:  Excited  Cognitive:  Appropriate  Insight:  Improving  Engagement in Group:  Engaged  Modes of Intervention:  Clarification, Education and Support  Summary of Progress/Problems: Positive thinking and self-care were discussed.   Marcille Buffy 01/11/2021, 9:14 PM

## 2021-01-11 NOTE — Progress Notes (Signed)
   01/11/21 2225  COVID-19 Daily Checkoff  Have you had a fever (temp > 37.80C/100F)  in the past 24 hours?  No  If you have had runny nose, nasal congestion, sneezing in the past 24 hours, has it worsened? No  COVID-19 EXPOSURE  Have you traveled outside the state in the past 14 days? No  Have you been in contact with someone with a confirmed diagnosis of COVID-19 or PUI in the past 14 days without wearing appropriate PPE? No  Have you been living in the same home as a person with confirmed diagnosis of COVID-19 or a PUI (household contact)? No  Have you been diagnosed with COVID-19? No

## 2021-01-11 NOTE — Progress Notes (Signed)
   01/11/21 2249  Psych Admission Type (Psych Patients Only)  Admission Status Voluntary  Psychosocial Assessment  Patient Complaints Anxiety  Eye Contact Fair  Facial Expression Animated  Affect Appropriate to circumstance  Speech Logical/coherent  Interaction Assertive  Appearance/Hygiene Unremarkable  Behavior Characteristics Appropriate to situation  Mood Pleasant;Anxious  Thought Process  Coherency WDL  Content WDL  Delusions None reported or observed  Perception WDL  Hallucination None reported or observed  Judgment Impaired  Confusion None  Danger to Self  Current suicidal ideation? Denies  Danger to Others  Danger to Others None reported or observed

## 2021-01-11 NOTE — [facility] Group Notes (Signed)
The focus of this group is to help patients establish daily goals to achieve during treatment and discuss how the patient can incorporate goal setting into their daily lives to aide in recovery.  Pt stated that her goal is to "be happy"

## 2021-01-11 NOTE — Progress Notes (Signed)
[facility]  01/11/2021 10:51 AM Sandra Garza  MRN:  [id number] Subjective: "I feel better today, still anxious but better"  In brief: Sandra Garza is a 35 year old Caucasian female with a psychiatric history significant for bipolar disorder type I, opioid use disorder and methamphetamine use disorder.  She was admitted for opioid detoxification and mood stabilization treatments.  Urine drug screen was positive on admission for amphetamines, opioids and THC.  Evaluation on the unit: Patient was seen, chart reviewed and case discussed with the treatment team. Patient stated that her mood is "good" and she feels better today.  She still is fidgety, rocking back and forth when standing still, she stated "I have always been like this, anxious." She rated her anxiety as 8/10, 10 being the worst, and referenced her life long history of anxiety. She does acknowledge still feeling depressed which she rates as 4  out of 10, 10 being the worst but reports she feels much less depressed than on admission.  She denies suicidal ideation or passive thoughts of death.  She denies HI/AVH, paranoia or delusions.  She stated she did not sleep well last night, She has an issue with staying asleep, not falling asleep. She woke up at 3 AM because she had a roommate come in. Discussed with patient her sleep cycle is disrupted due to her drug use and if she stays sober her sleep cycles should become more normal.  Encouraged patient to ask for repeat dose of Trazodone to improve her sleep. The patient denies any problems with her medication or medication side effects. She reported her appetite is good.  The patient states that she still gets anxious at times but she is able to calm down when she takes Vistaril for anxiety. She is requesting to have her Vistaril increased, instead will increase gabapentin from BID to TID to help with anxiety.  She denies other physical  problems.  She is waiting for a substance abuse rehab  bed. She is attending group activities and interacting appropriately in the therapeutic milieu. Will continue to offer support and encouragement.   Principal Problem: Bipolar 1 disorder (HCC) Diagnosis: Principal Problem:   Bipolar 1 disorder (HCC) Active Problems:   Chronic pain syndrome   Opioid use disorder, severe, dependence (HCC)   Methamphetamine use disorder, severe, dependence (HCC)  Total Time spent with patient: 20 minutes  Past Psychiatric History: See H&P  Past Medical History:  Past Medical History:  Diagnosis Date  . ADHD   . Anxiety   . Asthma   . Bipolar 1 disorder, depressed (HCC)   . Drug abuse (HCC)   . Hypercholesteremia   . Pinched nerve in neck    C 6-7    Past Surgical History:  Procedure Laterality Date  . ADENOIDECTOMY    . CESAREAN SECTION    . CYSTECTOMY     on overy  . TONSILLECTOMY    . TUBAL LIGATION    . WISDOM TOOTH EXTRACTION     Family History:  Family History  Problem Relation Age of Onset  . Heart disease Maternal Grandmother    Family Psychiatric  History: See H&P Social History:  Social History   Substance and Sexual Activity  Alcohol Use Not Currently     Social History   Substance and Sexual Activity  Drug Use Yes  . Types: Marijuana, Cocaine, Methamphetamines    Social History   Socioeconomic History  . Marital status: Single    Spouse name: Not on  file  . Number of children: Not on file  . Years of education: Not on file  . Highest education level: Not on file  Occupational History  . Not on file  Tobacco Use  . Smoking status: Current Every Day Smoker    Packs/day: 0.75    Types: Cigarettes    Start date: 12/26/2006  . Smokeless tobacco: Never Used  Vaping Use  . Vaping Use: Never used  Substance and Sexual Activity  . Alcohol use: Not Currently  . Drug use: Yes    Types: Marijuana, Cocaine, Methamphetamines  . Sexual activity: Yes    Birth control/protection: Surgical  Other Topics Concern  .  Not on file  Social History Narrative  . Not on file   Social Determinants of Health   Financial Resource Strain: Not on file  Food Insecurity: Not on file  Transportation Needs: Not on file  Physical Activity: Not on file  Stress: Not on file  Social Connections: Not on file   Additional Social History:   Sleep: Fair  Appetite:  Good  Current Medications: Current Facility-Administered Medications  Medication Dose Route Frequency Provider Last Rate Last Admin  . acetaminophen (TYLENOL) tablet 650 mg  650 mg Oral Q6H PRN Antonieta Pert, MD   650 mg at 01/05/21 2111  . alum & mag hydroxide-simeth (MAALOX/MYLANTA) 200-200-20 MG/5ML suspension 30 mL  30 mL Oral Q4H PRN Antonieta Pert, MD      . dicyclomine (BENTYL) tablet 20 mg  20 mg Oral Q6H PRN Jaclyn Shaggy, PA-C   20 mg at 01/10/21 0351  . FLUoxetine (PROZAC) capsule 20 mg  20 mg Oral Daily Laveda Abbe, NP   20 mg at 01/11/21 0801  . gabapentin (NEURONTIN) capsule 200 mg  200 mg Oral BID Laveda Abbe, NP   200 mg at 01/11/21 0801  . hydrOXYzine (ATARAX/VISTARIL) tablet 25 mg  25 mg Oral TID PRN Antonieta Pert, MD   25 mg at 01/11/21 0356  . magnesium hydroxide (MILK OF MAGNESIA) suspension 30 mL  30 mL Oral Daily PRN Antonieta Pert, MD      . melatonin tablet 3 mg  3 mg Oral QHS Laveda Abbe, NP   3 mg at 01/10/21 2104  . nicotine (NICODERM CQ - dosed in mg/24 hours) patch 21 mg  21 mg Transdermal Daily Antonieta Pert, MD   21 mg at 01/11/21 0803  . ondansetron (ZOFRAN-ODT) disintegrating tablet 4 mg  4 mg Oral Q6H PRN Jaclyn Shaggy, PA-C   4 mg at 01/10/21 0351  . traZODone (DESYREL) tablet 50 mg  50 mg Oral QHS,MR X 1 Laveda Abbe, NP   50 mg at 01/10/21 2104    Lab Results: No results found for this or any previous visit (from the past 48 hour(s)).  Blood Alcohol level:  Lab Results  Component Value Date   ETH <10 01/02/2021   ETH <10 12/31/2020    Metabolic  Disorder Labs: Lab Results  Component Value Date   HGBA1C 4.5 (L) 01/04/2021   MPG 82.45 01/04/2021   No results found for: PROLACTIN Lab Results  Component Value Date   CHOL 142 01/04/2021   TRIG 146 01/04/2021   HDL 42 01/04/2021   CHOLHDL 3.4 01/04/2021   VLDL 29 01/04/2021   LDLCALC 71 01/04/2021    Physical Findings: AIMS: Facial and Oral Movements Muscles of Facial Expression: None, normal Lips and Perioral Area: None, normal Jaw: None,  normal Tongue: None, normal,Extremity Movements Upper (arms, wrists, hands, fingers): None, normal Lower (legs, knees, ankles, toes): None, normal, Trunk Movements Neck, shoulders, hips: None, normal, Overall Severity Severity of abnormal movements (highest score from questions above): None, normal Incapacitation due to abnormal movements: None, normal Patient's awareness of abnormal movements (rate only patient's report): No Awareness, Dental Status Current problems with teeth and/or dentures?: No Does patient usually wear dentures?: No  CIWA:  CIWA-Ar Total: 4 COWS:  COWS Total Score: 0  Musculoskeletal: Strength & Muscle Tone: within normal limits Gait & Station: normal Patient leans: N/A  Psychiatric Specialty Exam: Physical Exam Vitals and nursing note reviewed.  Constitutional:      Appearance: She is not diaphoretic.  HENT:     Head: Normocephalic and atraumatic.  Pulmonary:     Effort: Pulmonary effort is normal.  Musculoskeletal:        General: Normal range of motion.     Cervical back: Normal range of motion.  Neurological:     General: No focal deficit present.     Mental Status: She is alert and oriented to person, place, and time.  Psychiatric:        Attention and Perception: She does not perceive auditory or visual hallucinations.        Mood and Affect: Mood is anxious and depressed. Affect is not angry.        Speech: Speech normal.        Behavior: Behavior normal.        Thought Content: Thought  content normal. Thought content is not paranoid or delusional. Thought content does not include homicidal or suicidal ideation.        Cognition and Memory: Cognition normal.        Judgment: Judgment normal.     Review of Systems  Constitutional: Negative for chills, diaphoresis, fatigue and fever.  HENT: Negative for sneezing.   Respiratory: Negative for cough and shortness of breath.   Cardiovascular: Positive for chest pain.       Chronic chest pain associated with anxiety; sharp stabbing in quality  Gastrointestinal: Negative for constipation, diarrhea, nausea and vomiting.  Genitourinary: Negative for difficulty urinating.  Musculoskeletal: Negative for back pain.  Neurological: Negative for dizziness.  Psychiatric/Behavioral: Negative for agitation, hallucinations, self-injury, sleep disturbance and suicidal ideas. The patient is nervous/anxious.     Blood pressure 123/90, pulse 98, temperature 98.2 F (36.8 C), temperature source Oral, resp. rate 18, height 5\' 4"  (1.626 m), weight 59.6 kg, last menstrual period 01/03/2021, SpO2 100 %.Body mass index is 22.57 kg/m.  General Appearance: Casual and Adequate hygiene  Eye Contact:  Good  Speech:  Clear and Coherent and Normal Rate  Volume:  Normal  Mood:  Anxious; less depressed, in good spirits today  Affect:  Appropriate and Congruent  Thought Process:  Coherent and Goal Directed  Orientation:  Full (Time, Place, and Person)  Thought Content:  Logical and No paranoid ideation or delusions.  No other delusions.  No referential thinking.  Suicidal Thoughts:  No  Homicidal Thoughts:  No  Memory:  Immediate;   Good Recent;   Good Remote;   Good  Judgement:  Fair  Insight:  Fair  Psychomotor Activity:  Normal  Concentration:  Concentration: Fair  Recall:  Fair  Fund of Knowledge:  Fair  Language:  Good  Akathisia:  No  Handed:  Right  AIMS (if indicated):     Assets:  Communication Skills Desire for Improvement Leisure  Time  Resilience Talents/Skills  ADL's:  Intact  Cognition:  WNL  Sleep:  Number of Hours: 4.25     Treatment Plan Summary: Daily contact with patient to assess and evaluate symptoms and progress in treatment and medication management  Continue every 15-minute observation status for safety of patient  Encourage participation in group therapy, recreation therapy, milieu therapy, CBT while on the unit  Medication management:   Mood/Depression  -Continue fluoxetine 20 mg daily for mood and anxiety symptoms.  Opioid withdrawal management -Discontinue COWS protocol  -Continue as needed medications as  indicated for symptom management; muscle spasms and nausea  -Increase  gabapentin 200 mg to three times daily  Insomnia  -Continue melatonin 3 mg at bedtime  -Continue trazodone 50 mg at bedtime as needed for insomnia, may repeat x1  Anxiety:  -Continue Vistaril 25 mg PO PRN three times daily  -Gabapentin 200 mg PO TID    Discharge planning is in progress.  Laveda Abbe, NP 01/11/2021, 10:51 AM

## 2021-01-11 NOTE — [facility] Group Notes (Signed)
LCSW Group Therapy Note  01/11/2021   10:45-11:43am   Type of Therapy and Topic:  Group Therapy: Anger Cues and Responses  Participation Level:  Active   Description of Group:   In this group, patients learned how to recognize the physical, cognitive, emotional, and behavioral responses they have to anger-provoking situations.  They identified a recent time they became angry and how they reacted.  They analyzed how their reaction was possibly beneficial and how it was possibly unhelpful.  The group discussed a variety of healthier coping skills that could help with such a situation in the future.  Focus was placed on how helpful it is to recognize the underlying emotions to our anger, because working on those can lead to a more permanent solution as well as our ability to focus on the important rather than the urgent.  Therapeutic Goals: 1. Patients will remember their last incident of anger and how they felt emotionally and physically, what their thoughts were at the time, and how they behaved. 2. Patients will identify how their behavior at that time worked for them, as well as how it worked against them. 3. Patients will explore possible new behaviors to use in future anger situations. 4. Patients will learn that anger itself is normal and cannot be eliminated, and that healthier reactions can assist with resolving conflict rather than worsening situations.  Summary of Patient Progress:  The patient shared that she struggles with anger towards herself and currently feels angry with herself.   Therapeutic Modalities:   Cognitive Behavioral Therapy  Sandra Garza

## 2021-01-11 NOTE — Progress Notes (Signed)
Patient rates depression 5/10, anxiety 10/10 and hopelessness 8/10 (10 being worst). She denies SI/HI. She denies AVH. She reports feeling hyper and only slept about four hours last night. She denies having any withdrawal symptoms. Her stated goal "get a bed for treatment."   Orders reviewed. Vital signs reviewed. Verbal support provided. 15 minute checks performed for safety.   Patient compliant with treatment plan.

## 2021-01-12 ENCOUNTER — Ambulatory Visit (HOSPITAL_COMMUNITY): Payer: Medicaid Other | Admitting: Professional

## 2021-01-12 MED ORDER — LAMOTRIGINE 25 MG PO TABS: 25.0000 mg | ORAL_TABLET | Freq: Every day | ORAL | 0 refills | Status: AC

## 2021-01-12 MED ORDER — LAMOTRIGINE 25 MG PO TABS
25.0000 mg | ORAL_TABLET | Freq: Every day | ORAL | Status: DC
  Administered 2021-01-12: 25 mg via ORAL
  Filled 2021-01-12 (×4): qty 1

## 2021-01-12 MED ORDER — HYDROXYZINE HCL 25 MG PO TABS: 25.0000 mg | ORAL_TABLET | Freq: Three times a day (TID) | ORAL | 0 refills | Status: AC | PRN

## 2021-01-12 MED ORDER — TRAZODONE HCL 50 MG PO TABS: 50.0000 mg | ORAL_TABLET | Freq: Every evening | ORAL | 0 refills | Status: AC | PRN

## 2021-01-12 MED ORDER — MELATONIN 3 MG PO TABS: 3.0000 mg | ORAL_TABLET | Freq: Every evening | ORAL | 0 refills | Status: AC | PRN

## 2021-01-12 MED ORDER — FLUOXETINE HCL 20 MG PO CAPS: 20.0000 mg | ORAL_CAPSULE | Freq: Every day | ORAL | 0 refills | Status: AC

## 2021-01-12 MED ORDER — GABAPENTIN 100 MG PO CAPS: 200.0000 mg | ORAL_CAPSULE | Freq: Three times a day (TID) | ORAL | 0 refills | Status: AC

## 2021-01-12 NOTE — Progress Notes (Signed)
Recreation Therapy Notes  Date: 3.7.22 Time: 0930 Location: [address] Dayroom  Group Topic: Stress Management  Goal Area(s) Addresses:  Patient will identify positive stress management techniques. Patient will identify benefits of using stress management post d/c.  Behavioral Response: Engaged  Intervention: Stress Management  Activity: Guided Imagery.  LRT read a script that took patients on a relaxing journey on the beach to listen to the peaceful waves and take in the all calming elements.  Patients were to listen as the script was read to engage in the activity.    Education:  Stress Management, Discharge Planning.   Education Outcome: Acknowledges Education  Clinical Observations/Feedback: Pt attended and participated in group session.    Caroll Rancher, LRT/CTRS         Lillia Abed, Maria Gallicchio A 01/12/2021 11:07 AM

## 2021-01-12 NOTE — [facility] Suicide Risk Assessment (Signed)
[facility] INPATIENT:  Family/Significant Other Suicide Prevention Education  Suicide Prevention Education:  Education Completed;  Sandra Garza (mother) [phone],  (name of family member/significant other) has been identified by the patient as the family member/significant other with whom the patient will be residing, and identified as the person(s) who will aid the patient in the event of a mental health crisis (suicidal ideations/suicide attempt).  With written consent from the patient, the family member/significant other has been provided the following suicide prevention education, prior to the and/or following the discharge of the patient.  The suicide prevention education provided includes the following:  Suicide risk factors  Suicide prevention and interventions  National Suicide Hotline telephone number  [facility] assessment telephone number  [facility] Emergency Assistance 911  [facility] and/or Residential Mobile Crisis Unit telephone number  Request made of family/significant other to:  Remove weapons (e.g., guns, rifles, knives), all items previously/currently identified as safety concern.    Remove drugs/medications (over-the-counter, prescriptions, illicit drugs), all items previously/currently identified as a safety concern.  The family member/significant other verbalizes understanding of the suicide prevention education information provided.  The family member/significant other agrees to remove the items of safety concern listed above.  Pt's mother states, "I am unsure what led her to the hospital. She left rehab and I told her I didn't want strangers in my home and she did it anyway and I told her to leave and I am not sure what happened after that.". Mother will pick her up and she can stay in her home. No weapons in the home. Mother states that she will continue to bring strangers in the home and feels that this is unsafe. Mother also worries that  she will bring drugs in the home.   Sandra Garza 01/12/2021, 11:56 AM

## 2021-01-12 NOTE — Discharge Summary (Signed)
Physician Discharge Summary Note  Patient:  Sandra Garza is an 35 y.o., female MRN:  [id number] DOB:  June 08, 1986 Patient phone:  [phone] (home)  Patient address:   [address] [place] [id number],  Total Time spent with patient: 30 minutes  Date of Admission:  01/03/2021 Date of Discharge: 01/12/2021  Reason for Admission:  (From NP's admission note): This is the first psychiatric admission assessment in this [facility] for this 35 year old Caucasian female with hx of mental health issues, opioid use disorder & methamphetamine. She [facility] with need for possible opioid detoxification & mood stabilization treatments. Chart review reports indicated that patient was [facility] ED by the EMS with chief complaints of erratic behaviors in public. And while at the ED, she was noted to be agitated, fidgety, manic, pacing around & talking to herself. Patient apparently has issues with heroin & methamphetamine addictions. She is on disability based on her mental health issues (Bipolar disorder). Sandra Garza is a mother of 3 children who are being raised by her mother. Her UDS was positive for: Amphetamine, Opioid & THC. During this admission assessments, Sandra Garza reports, "The ambulance brought [facility] the [facility] yesterday. The police called EMS for me. The staff at the [employer] restaurant called the cops. I was at [employer] restaurant using their bathroom. While in the bathroom, I was talking to myself. I was aware of what I was doing, just talking to myself about what has been going on with my life. I have no where to go or live. I have been homeless for 2 years due to my drug addictions. I'm on disability for my mental health issues. I can only afford my drugs to feed my habit, but unable to afford a place to stay the same time. I have been on drugs for 2 years. Heroin & Methamphetamine are my drugs of choice. I was snorting  them. I last used heroin & Meth on Thursday, 2 days ago. I'm not having any withdrawal symptoms as of yet. I was sober for 2 weeks prior to using 2 days ago. I relapsed because of my homelessness. It is hard to live on the streets. The streets are rough. I was diagnosed with Bipolar disorder at age 35. I was taking medications, but not very complaint when using drugs. I don't have an outpatient psychiatric provider. I was [facility] from February 2nd, 2022 thru the 9th. I was discharged on Prozac, Lamictal & gabapentin. I'm not suicidal, homicidal, hearing voices or seeing things. But, I do have mood swings up to 5 times a week, unprovoked. I sleep very poorly at night. My depression at this time is #8 & anxiety #8. I started using drugs at age 35, introduced to drugs by my boyfriend".  Evaluation on the unit, day of discharge: Patient reported she slept well last night and di not wake up until morning, she slept 6.5 hours. She is eating well. She denies SI/HI/AVH, paranoia and delusions. She denies drug cravings. She has been attending group therapy and interacting appropriately in the therapeutic milieu. She will return to her mother's house and will call daily to check for an open rehab bed. [facility] did not have beds today. She is calm and cooperative, however she does appear anxious and fidgety. She stated she has always been anxious. Her bipolar mood stabilizer, Lamictal 25 mg was restarted today since  she is no longer in active opioid withdrawal. She agrees to follow up with her outpatient provider for dose increases and medication management. Patient is stable for discharge home today.   Principal Problem: Bipolar 1 disorder (HCC) Discharge Diagnoses: Principal Problem:   Bipolar 1 disorder (HCC) Active Problems:   Chronic pain syndrome   Opioid use disorder, severe, dependence (HCC)   Methamphetamine use disorder, severe, dependence (HCC)  Past Psychiatric History:  See H&P  Past Medical History:  Past Medical History:  Diagnosis Date  . ADHD   . Anxiety   . Asthma   . Bipolar 1 disorder, depressed (HCC)   . Drug abuse (HCC)   . Hypercholesteremia   . Pinched nerve in neck    C 6-7    Past Surgical History:  Procedure Laterality Date  . ADENOIDECTOMY    . CESAREAN SECTION    . CYSTECTOMY     on overy  . TONSILLECTOMY    . TUBAL LIGATION    . WISDOM TOOTH EXTRACTION     Family History:  Family History  Problem Relation Age of Onset  . Heart disease Maternal Grandmother    Family Psychiatric  History: See H&P Social History:  Social History   Substance and Sexual Activity  Alcohol Use Not Currently     Social History   Substance and Sexual Activity  Drug Use Yes  . Types: Marijuana, Cocaine, Methamphetamines    Social History   Socioeconomic History  . Marital status: Single    Spouse name: Not on file  . Number of children: Not on file  . Years of education: Not on file  . Highest education level: Not on file  Occupational History  . Not on file  Tobacco Use  . Smoking status: Current Every Day Smoker    Packs/day: 0.75    Types: Cigarettes    Start date: 12/26/2006  . Smokeless tobacco: Never Used  Vaping Use  . Vaping Use: Never used  Substance and Sexual Activity  . Alcohol use: Not Currently  . Drug use: Yes    Types: Marijuana, Cocaine, Methamphetamines  . Sexual activity: Yes    Birth control/protection: Surgical  Other Topics Concern  . Not on file  Social History Narrative  . Not on file   Social Determinants of Health   Financial Resource Strain: Not on file  Food Insecurity: Not on file  Transportation Needs: Not on file  Physical Activity: Not on file  Stress: Not on file  Social Connections: Not on file    Hospital Course:  After the above admission evaluation, Sandra Garza's presenting symptoms were noted. He was recommended for mood stabilization treatments. The medication regimen targeting  those presenting symptoms were discussed with him & initiated with his consent. Her UDS on admission to the ED was positive for marijuana, opiates and amphetamines, her alcohol level was negative. He confessed to drinking a pint liquor prior to admission and was placed on a COWS protocol. She did develop any opiate withdrawal symptoms & did receive opiate withdrawal treatments. Her identified mood symptoms were medicated, stabilized & she was discharged on the medications as listed on her discharge medication list below. Besides the mood stabilization treatments, Sandra Garza was also enrolled & participated in the group counseling sessions being offered & held on this unit. He learned coping skills. She presented no other significant pre-existing medical issues that required treatment. She tolerated his treatment regimen without any adverse effects or reactions reported.   During the  course of her hospitalization, the 15-minute checks were adequate to ensure patient's safety. Sandra Garza did not display any dangerous, violent or suicidal behavior on the unit. She interacted with patients & staff appropriately, participated appropriately in the group sessions/therapies. Her medications were addressed & adjusted to meet his needs. She was recommended for outpatient follow-up care & medication management upon discharge to assure continuity of care & mood stability.  At the time of discharge patient is not reporting any acute suicidal/homicidal ideations. She feels more confident about her self-care & in managing his mental health. She currently denies any new issues or concerns. Education and supportive counseling provided throughout her hospital stay & upon discharge.   Today upon her discharge evaluation with the attending psychiatrist, Sandra Garza shares she is doing well. She denies any other specific concerns. She is sleeping well. Her appetite is good. She denies other physical complaints. She denies AH/VH, delusional  thoughts or paranoia. She does not appear to be responding to any internal stimuli. She feels that her medications have been helpful & is in agreement to continue his current treatment regimen as recommended. She was able to engage in safety planning including plan to return to [facility] or contact emergency services if he feels unable to maintain his own safety or the safety of others. Pt had no further questions, comments, or concerns. [facility] with all personal belongings in no apparent distress. Transportation per her mother.   Physical Findings: AIMS: Facial and Oral Movements Muscles of Facial Expression: None, normal Lips and Perioral Area: None, normal Jaw: None, normal Tongue: None, normal,Extremity Movements Upper (arms, wrists, hands, fingers): None, normal Lower (legs, knees, ankles, toes): None, normal, Trunk Movements Neck, shoulders, hips: None, normal, Overall Severity Severity of abnormal movements (highest score from questions above): None, normal Incapacitation due to abnormal movements: None, normal Patient's awareness of abnormal movements (rate only patient's report): No Awareness, Dental Status Current problems with teeth and/or dentures?: No Does patient usually wear dentures?: No  CIWA:  CIWA-Ar Total: 5 COWS:  COWS Total Score: 1  Musculoskeletal: Strength & Muscle Tone: within normal limits Gait & Station: normal Patient leans: N/A  Psychiatric Specialty Exam: See MD's Discharge SRA Physical Exam Vitals and nursing note reviewed.  Constitutional:      Appearance: Normal appearance.  HENT:     Head: Normocephalic.  Pulmonary:     Effort: Pulmonary effort is normal.  Musculoskeletal:        General: Normal range of motion.     Cervical back: Normal range of motion.  Neurological:     Mental Status: She is alert and oriented to person, place, and time.  Psychiatric:        Attention and Perception: Attention normal. She does not perceive auditory or visual  hallucinations.        Mood and Affect: Mood normal.        Speech: Speech normal.        Behavior: Behavior normal. Behavior is cooperative.        Thought Content: Thought content normal.        Cognition and Memory: Cognition normal.     Review of Systems  Constitutional: Negative.   HENT: Negative for rhinorrhea, sneezing and sore throat.   Respiratory: Negative for cough, chest tightness, shortness of breath and wheezing.   Gastrointestinal: Negative.   Genitourinary: Negative.   Musculoskeletal: Negative.   Neurological: Negative.     Blood pressure 110/76, pulse (!) 117, temperature 97.6 F (36.4  C), temperature source Oral, resp. rate 16, height  (1.626 m), weight 59.6 kg, last menstrual period 01/03/2021, SpO2 98 %.Body mass index is 22.57 kg/m.  Sleep:  Number of Hours: 6.5     Have you used any form of tobacco in the last 30 days? (Cigarettes, Smokeless Tobacco, Cigars, and/or Pipes): Yes  Has this patient used any form of tobacco in the last 30 days? (Cigarettes, Smokeless Tobacco, Cigars, and/or Pipes) Yes, N/A  Blood Alcohol level:  Lab Results  Component Value Date   ETH <10 01/02/2021   ETH <10 12/31/2020    Metabolic Disorder Labs:  Lab Results  Component Value Date   HGBA1C 4.5 (L) 01/04/2021   MPG 82.45 01/04/2021   No results found for: PROLACTIN Lab Results  Component Value Date   CHOL 142 01/04/2021   TRIG 146 01/04/2021   HDL 42 01/04/2021   CHOLHDL 3.4 01/04/2021   VLDL 29 01/04/2021   LDLCALC 71 01/04/2021    See Psychiatric Specialty Exam and Suicide Risk Assessment completed by Attending Physician prior to discharge.  Discharge destination:  Home  Is patient on multiple antipsychotic therapies at discharge:  No   Has Patient had three or more failed trials of antipsychotic monotherapy by history:  No  Recommended Plan for Multiple Antipsychotic Therapies: NA  Discharge Instructions    Diet - low sodium heart healthy    Complete by: As directed    Increase activity slowly   Complete by: As directed      Allergies as of 01/12/2021      Reactions   Ceclor [cefaclor] Hives      Medication List    STOP taking these medications   lamoTRIgine 100 MG tablet Commonly known as: LAMICTAL     TAKE these medications     Indication  FLUoxetine 20 MG capsule Commonly known as: PROZAC Take 1 capsule (20 mg total) by mouth daily.  Indication: Major Depressive Disorder   gabapentin 100 MG capsule Commonly known as: NEURONTIN Take 2 capsules (200 mg total) by mouth 3 (three) times daily. What changed:   medication strength  how much to take  when to take this  Indication: substance withdrawal   hydrOXYzine 25 MG tablet Commonly known as: ATARAX/VISTARIL Take 1 tablet (25 mg total) by mouth 3 (three) times daily as needed for anxiety.  Indication: Feeling Anxious   melatonin 3 MG Tabs tablet Take 1 tablet (3 mg total) by mouth at bedtime as needed. What changed: reasons to take this  Indication: Trouble Sleeping   traZODone 50 MG tablet Commonly known as: DESYREL Take 1 tablet (50 mg total) by mouth at bedtime and may repeat dose one time if needed.  Indication: Trouble Sleeping       Follow-up Information    [facility]. Go on 01/20/2021.   Specialty: Behavioral Health Why: You have a VIRTUAL appointment for therapy services on 01/20/21 at 8:00 am.  You also have an appointment for medication management on 02/05/21 at 1:00 pm.  These are Virtual appointments. Contact information: [address] [place] [address] [phone]       [facility] Follow up.   Specialty: Addiction Medicine Why: referral made Contact information: [address] [place] [address] [phone]        [facility] Follow up.   Why: referral made Contact information: [address] [place]  [place] [address] [phone]  Follow-up recommendations:  Activity:  as tolerated Diet:  Heart healthy  Comments:  Prescriptions given at discharge.  Patient agreeable to plan.  Given opportunity to ask questions.  Appears to feel comfortable with discharge denies any current suicidal or homicidal thoughts.   Patient is instructed prior to discharge to: Take all medications as prescribed by her mental healthcare provider. Report any adverse effects and or reactions from the medicines to her outpatient provider promptly. Patient has been instructed & cautioned: To not engage in alcohol and or illegal drug use while on prescription medicines. In the event of worsening symptoms, patient is instructed to call the crisis hotline, 911 and or go to the nearest ED for appropriate evaluation and treatment of symptoms. To follow-up with her primary care provider for your other medical issues, concerns and or health care needs.  Signed: Laveda Abbe, NP 01/12/2021, 11:43 AM

## 2021-01-12 NOTE — Progress Notes (Signed)
  [facility] Adult Case Management Discharge Plan :  Will you be returning to the same living situation after discharge:  No.with mother At discharge, do you have transportation home?: Yes,  via mother Do you have the ability to pay for your medications: Yes,  has medicaid  Release of information consent forms completed and in the chart;  Patient's signature needed at discharge.  Patient to Follow up at:  Follow-up Information    [facility]. Go on 01/20/2021.   Specialty: Behavioral Health Why: You have a VIRTUAL appointment for therapy services on 01/20/21 at 8:00 am.  You also have an appointment for medication management on 02/05/21 at 1:00 pm.  These are Virtual appointments. Contact information: [address] [place] [address] [phone]       [facility] Follow up.   Specialty: Addiction Medicine Why: referral made Contact information: [address] [place] [address] [phone]        [facility] Follow up.   Why: referral made Contact information: [address] [place] [address] [phone]               Next level of care provider has access to [facility] Link:yes  Safety Planning and Suicide Prevention discussed: Yes,  with mother  Have you used any form of tobacco in the last 30 days? (Cigarettes, Smokeless Tobacco, Cigars, and/or Pipes): Yes  Has patient been referred to the Quitline?: Patient refused referral  Patient has been referred for addiction treatment: Yes  Felizardo Hoffmann, LCSWA 01/12/2021, 12:02 PM

## 2021-01-12 NOTE — BHH Group Notes (Signed)
LCSW Group Therapy Note  Type of Therapy/Topic: Group Therapy: Six Dimensions of Wellness  Participation Level: Active   Description of Group:  This group will address the concept of wellness and the six concepts of wellness: occupational, physical, social, intellectual, spiritual, and emotional. Patients will be encouraged to process areas in their lives that are out of balance and identify reasons for remaining unbalanced. Patients will be encouraged to explore ways to practice healthy habits daily to attain better physical and mental health outcomes.  Therapeutic Goals:  1. Identify aspects of wellness that they are doing well.  2. Identify aspects of wellness that they would like to improve upon.  3. Identify one action they can take to improve an aspect of wellness in their lives.  Summary of Patient Progress: Sandra Garza spent time discussing wellness and coping skills with her peer while doing a recreational activity outside.

## 2021-01-12 NOTE — Plan of Care (Signed)
Pt. Smiling, out in open areas, progressing with goals

## 2021-01-12 NOTE — BHH Group Notes (Signed)
Occupational Therapy Group Note Date: 01/12/2021 Group Topic/Focus: Stress Management  Group Description: Group encouraged increased participation and engagement through discussion focused on topic of stress management. Patients engaged interactively to discuss components of stress including physical signs, emotional signs, negative management strategies, and positive management strategies. Each individual identified one new stress management strategy they would like to try moving forward.    Therapeutic Goals: Identify current stressors Identify healthy vs unhealthy stress management strategies/techniques Discuss and identify physical and emotional signs of stress Participation Level: Active   Participation Quality: Independent   Behavior: Calm, Cooperative and Interactive   Speech/Thought Process: Focused   Affect/Mood: Full range   Insight: Fair   Judgement: Fair   Individualization: Sandra Garza was active in their participation of discussion, sharing openly and offering support to peers. Pt identified "my mom wants [place] to move to [place]" as something that is currently stressful to them. Receptive to support and education surrounding strategies manage current stressors. Pt actively engaged for duration - left x 2 to use the bathroom and get a drink of water.   Modes of Intervention: Discussion, Education, Socialization and Support  Patient Response to Interventions:  Attentive, Engaged, Receptive and Interested   Plan: Continue to engage patient in OT groups 2 - 3x/week.  01/12/2021  Donne Hazel, MOT, OTR/L

## 2021-01-12 NOTE — BHH Suicide Risk Assessment (Signed)
[facility] Discharge Suicide Risk Assessment   Principal Problem: Bipolar 1 disorder [facility]) Discharge Diagnoses: Principal Problem:   Bipolar 1 disorder (HCC) Active Problems:   Chronic pain syndrome   Opioid use disorder, severe, dependence (HCC)   Methamphetamine use disorder, severe, dependence (HCC)   Total Time spent with patient: 20 minutes  Musculoskeletal: Strength & Muscle Tone: within normal limits Gait & Station: normal Patient leans: N/A  Psychiatric Specialty Exam: Review of Systems  Constitutional: Negative for chills and fever.  HENT: Negative for sneezing and sore throat.   Respiratory: Negative for cough and shortness of breath.   Cardiovascular: Negative for chest pain.  Gastrointestinal: Negative for constipation, diarrhea, nausea and vomiting.  Genitourinary: Negative for difficulty urinating.  Musculoskeletal: Negative for arthralgias and neck pain.  Neurological: Negative for dizziness and headaches.  Psychiatric/Behavioral: Negative for agitation, confusion, dysphoric mood, hallucinations, self-injury, sleep disturbance and suicidal ideas. The patient is nervous/anxious.     Blood pressure 110/76, pulse (!) 117, temperature 97.6 F (36.4 C), temperature source Oral, resp. rate 16, height 5\' 4"  (1.626 m), weight 59.6 kg, last menstrual period 01/03/2021, SpO2 98 %.Body mass index is 22.57 kg/m.  General Appearance: Casual and well groomed  Eye Contact::  Good  Speech:  Clear and Coherent and Normal Rate409  Volume:  Normal  Mood:  "Good" but anxious at times  Affect:  Appropriate and Congruent  Thought Process:  Coherent and Goal Directed  Orientation:  Full (Time, Place, and Person)  Thought Content:  Logical and no paranoid ideation, referential thinking or delusional content elicited  Suicidal Thoughts:  No  Homicidal Thoughts:  No  Memory:  Immediate;   Good Recent;   Good Remote;   Good  Judgement:  Fair  Insight:  Fair  Psychomotor Activity:   Normal  Concentration:  Good  Recall:  Good  Fund of Knowledge:Good  Language: Good  Akathisia:  No  Handed:  Right  AIMS (if indicated):     Assets:  Communication Skills Desire for Improvement Leisure Time Resilience Social Support Talents/Skills  Sleep:  Number of Hours: 6.5  Cognition: WNL  ADL's:  Intact   Mental Status Per Nursing Assessment::   On Admission:  Self-harm behaviors  Demographic Factors:  Unemployed    Loss Factors:  financial stressors; legal stressors  Historical Factors: Prior suicide attempts and Impulsivity  Risk Reduction Factors:   Responsible for children under 95 years of age and Sense of responsibility to family  Continued Clinical Symptoms:  More than one psychiatric diagnosis Previous Psychiatric Diagnoses and Treatments  Bipolar Disorder, currently euthymic Anxiety substance use disorder  Cognitive Features That Contribute To Risk:  None    Suicide Risk:  Minimal: No identifiable suicidal ideation.  Patients presenting with no risk factors but with morbid ruminations; may be classified as minimal risk based on the severity of the depressive symptoms   Follow-up Information    [facility]. Go on 01/20/2021.   Specialty: Behavioral Health Why: You have a VIRTUAL appointment for therapy services on 01/20/21 at 8:00 am.  You also have an appointment for medication management on 02/05/21 at 1:00 pm.  These are Virtual appointments. Contact information: [address] [place] [phone]       [facility] Follow up.   Specialty: Addiction Medicine Why: referral made Contact information: [address] [place] [phone]        [facility] Follow up.   Why: referral made Contact information: [address] [place] [address] [phone]               Plan Of Care/Follow-up  recommendations:  Activity:  As tolerated Other:  Do not use drugs or alcohol.  Keep all follow-up appointments with medication management, substance use disorder treatment and therapy.  Claudie Revering, MD 01/12/2021, 12:16 PM

## 2021-01-12 NOTE — Progress Notes (Signed)
Discharge Note:  Patient denies SI/HI AVH at this time. Discharge instructions, AVS, prescriptions and transition record gone over with patient. Patient agrees to comply with medication management, follow-up visit, and outpatient therapy. Patient belongings returned to patient. Patient questions and concerns addressed and answered.  Patient ambulatory off unit.  Patient discharged to home.   

## 2021-01-20 ENCOUNTER — Ambulatory Visit (HOSPITAL_COMMUNITY): Payer: Medicaid Other | Admitting: Professional

## 2021-01-28 ENCOUNTER — Ambulatory Visit (HOSPITAL_COMMUNITY): Payer: Medicaid Other | Admitting: Professional

## 2021-01-28 ENCOUNTER — Telehealth (HOSPITAL_COMMUNITY): Payer: Self-pay | Admitting: Professional

## 2021-01-28 ENCOUNTER — Other Ambulatory Visit: Payer: Self-pay

## 2021-01-28 NOTE — Telephone Encounter (Signed)
See call log 

## 2021-02-05 ENCOUNTER — Telehealth (HOSPITAL_COMMUNITY): Payer: Medicaid Other | Admitting: Psychiatry

## 2021-03-20 IMAGING — CT CT HEAD W/O CM
3 of 6 series · 14 of 47 positions shown, 17 images · non-contrast
Comparison: None.

CLINICAL DATA: Delirium, altered mental status, illicit drug abuse

EXAM:
CT HEAD WITHOUT CONTRAST
TECHNIQUE: Contiguous axial images were obtained from the base of the skull
through the vertex without intravenous contrast.

[Series 2: head wo · axial · 0.47mm/px · z∈[-113,+7]mm · 9 of 30 slices shown, 12 images]
[im 4/30  brain]
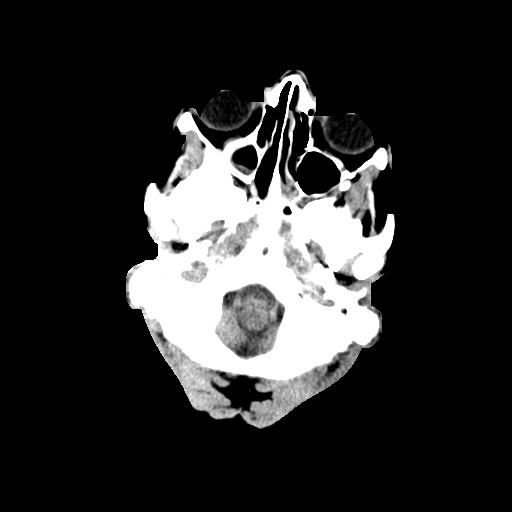
[im 4/30  bone]
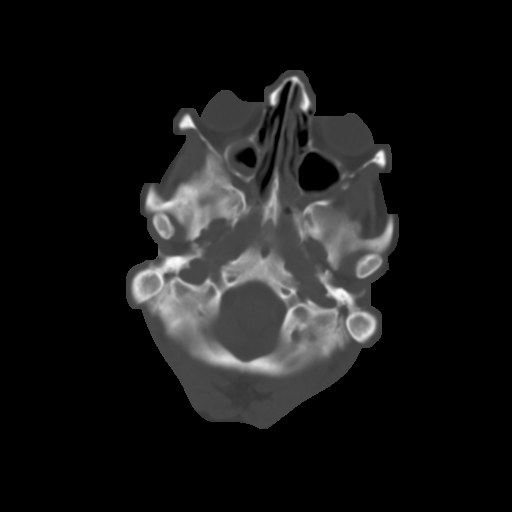
[im 7/30  brain]
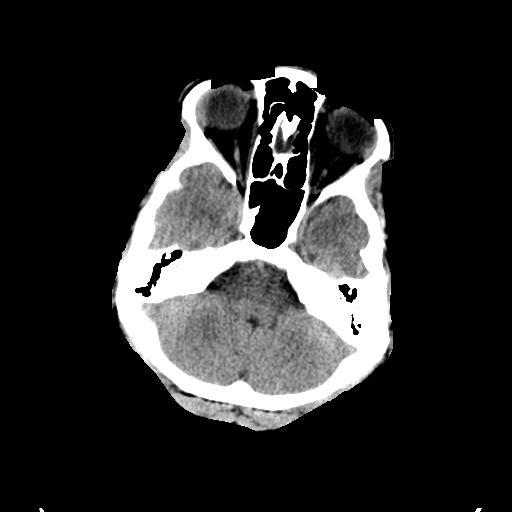
[im 10/30  brain]
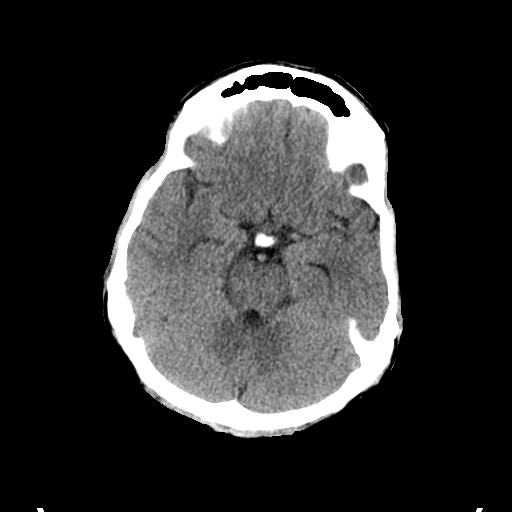
[im 13/30  brain]
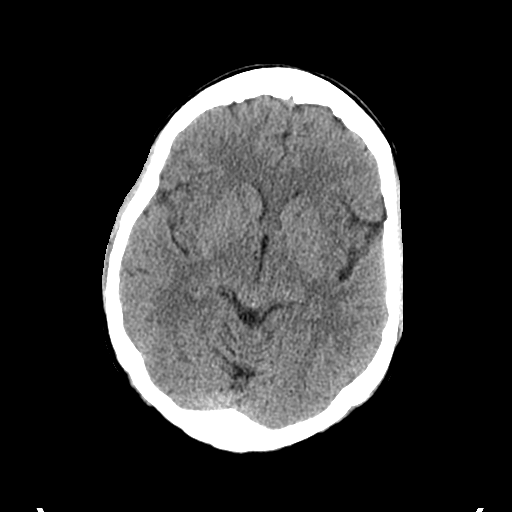
[im 16/30  brain]
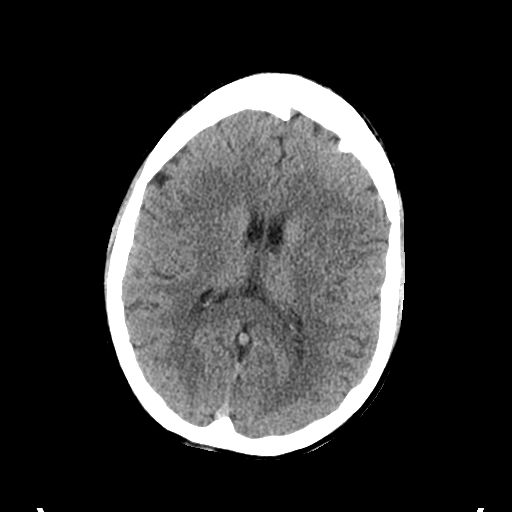
[im 16/30  bone]
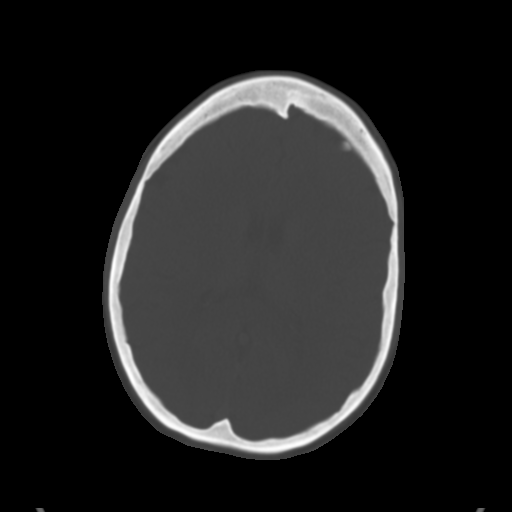
[im 19/30  brain]
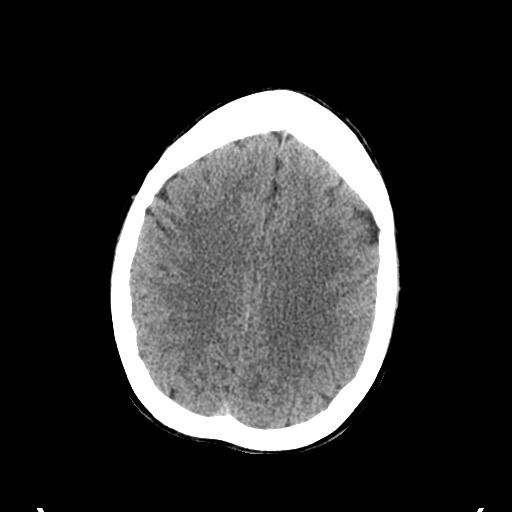
[im 22/30  brain]
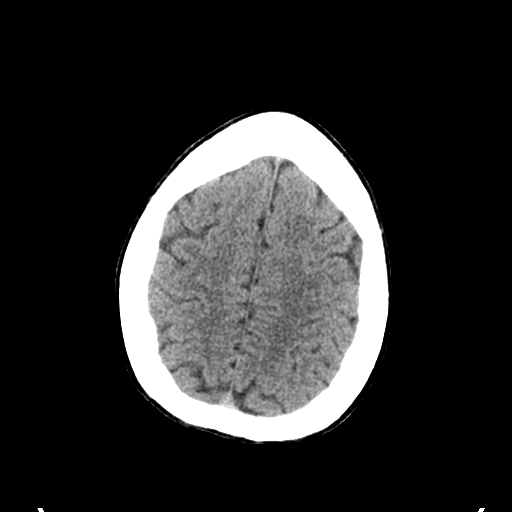
[im 25/30  brain]
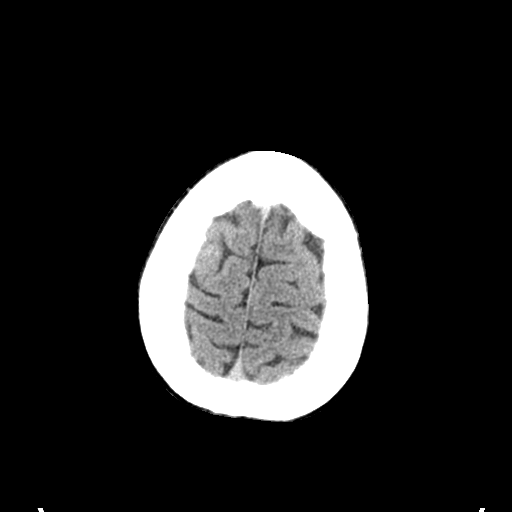
[im 28/30  brain]
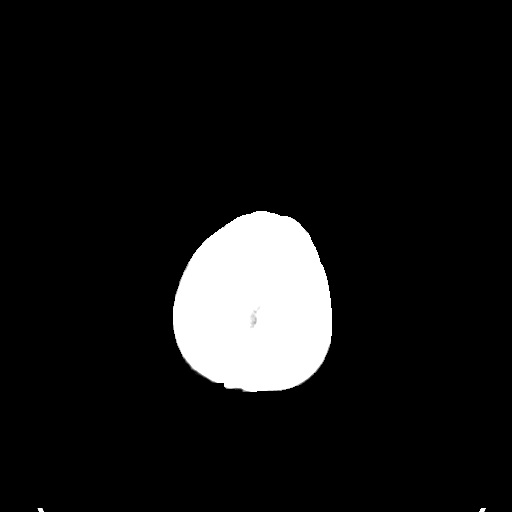
[im 28/30  bone]
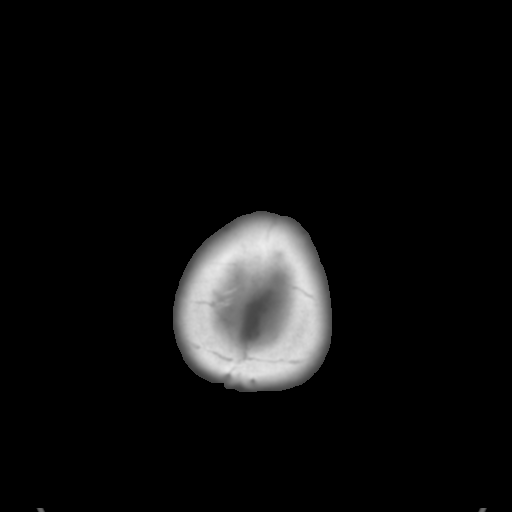

[Series 6: coronal soft tissue · coronal · 0.29mm/px · 3 of 63 slices shown]
[im 11/63  brain]
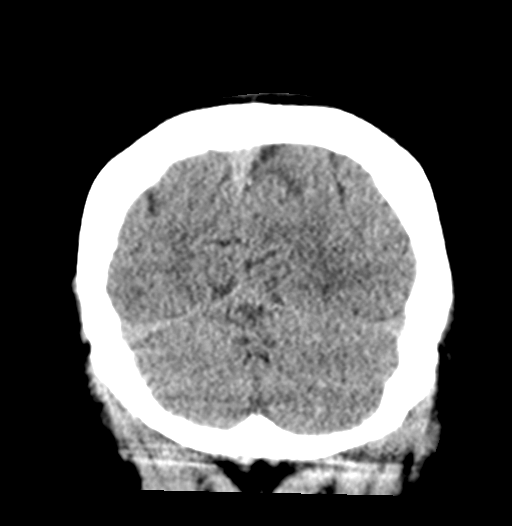
[im 22/63  brain]
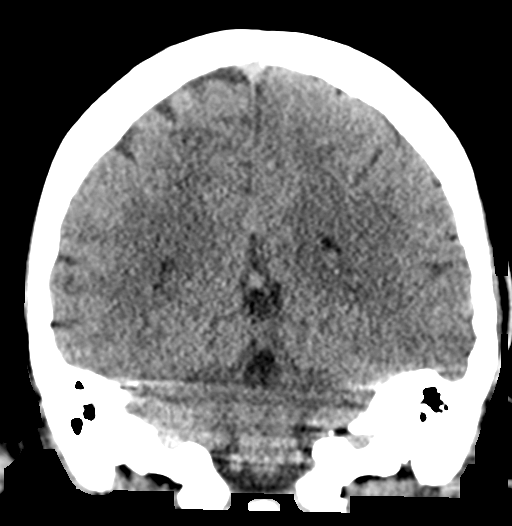
[im 32/63  brain]
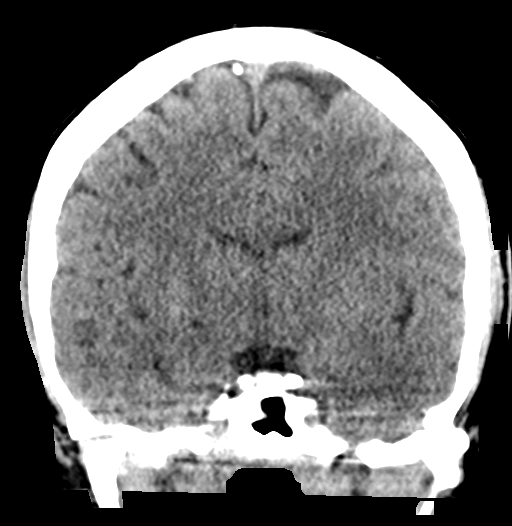

[Series 9: sagittal soft tissue · sagittal · 0.16mm/px · 2 of 48 slices shown]
[im 16/48  brain]
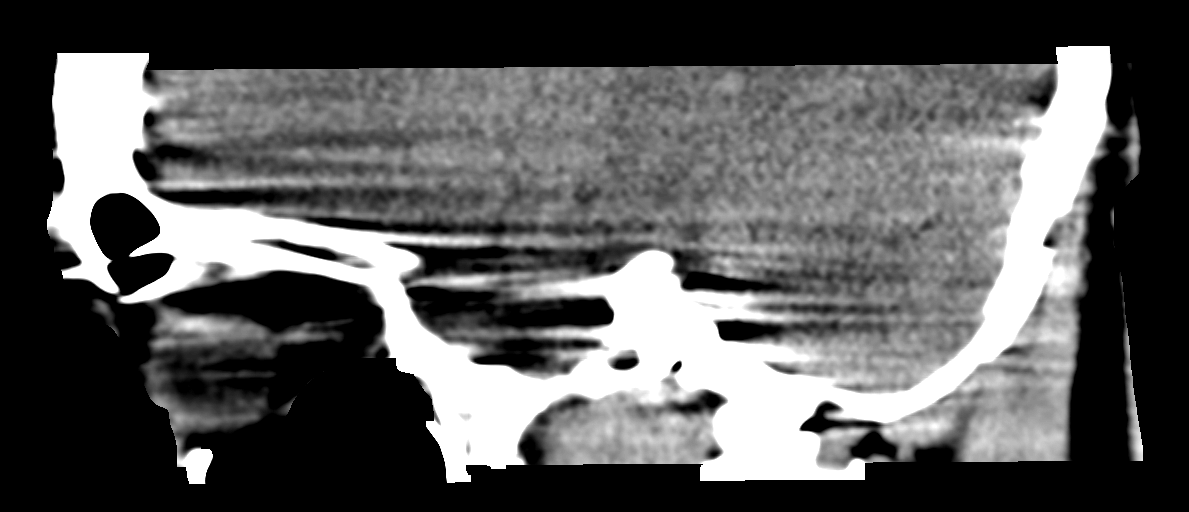
[im 32/48  brain]
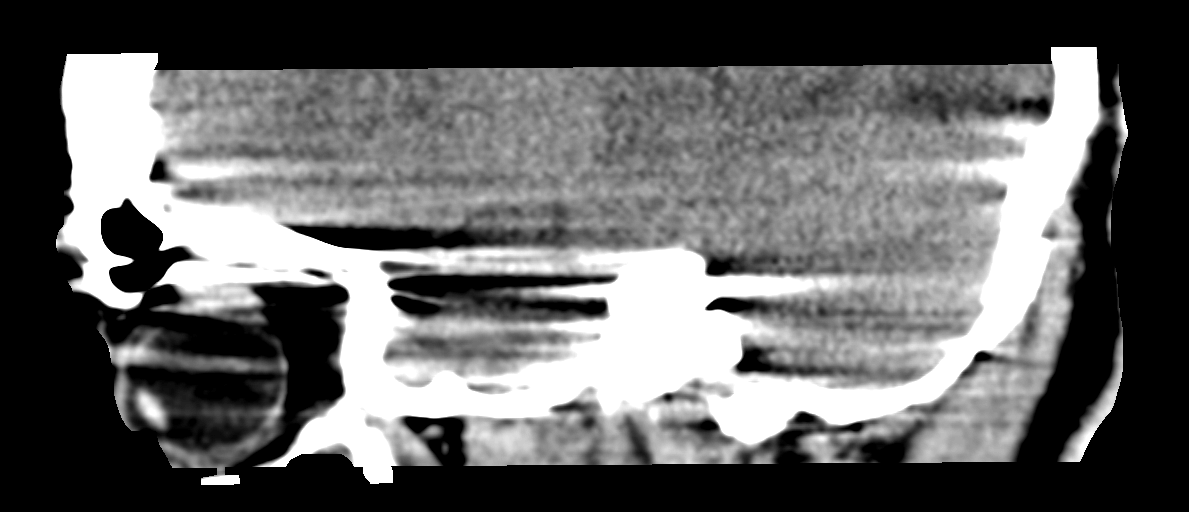

[14 of 47 positions shown; findings below may reference images not displayed]

FINDINGS: Brain: Normal anatomic configuration. No abnormal intra or
extra-axial mass lesion or fluid collection. No abnormal mass effect
or midline shift. No evidence of acute intracranial hemorrhage or
infarct. Ventricular size is normal. Cerebellum unremarkable.

Vascular: Unremarkable

Skull: Intact

Sinuses/Orbits: There is moderate mucosal thickening within the
right maxillary sinus. Remaining paranasal sinuses are clear. Orbits
are unremarkable.

Other: Mastoid air cells and middle ear cavities are clear.
IMPRESSION: No acute intracranial abnormality.

Right maxillary paranasal sinus disease.

## 2022-04-13 ENCOUNTER — Encounter: Payer: Self-pay | Admitting: *Deleted

## 2022-07-10 ENCOUNTER — Emergency Department (HOSPITAL_COMMUNITY)
Admission: EM | Admit: 2022-07-10 | Discharge: 2022-07-10 | Disposition: A | Discharge status: Home / Self Care | Payer: Medicaid Other | Attending: Emergency Medicine | Admitting: Emergency Medicine

## 2022-07-10 ENCOUNTER — Other Ambulatory Visit: Payer: Self-pay

## 2022-07-10 ENCOUNTER — Encounter (HOSPITAL_COMMUNITY): Payer: Self-pay

## 2022-07-10 DIAGNOSIS — R102 Pelvic and perineal pain: Secondary | ICD-10-CM | POA: Diagnosis present

## 2022-07-10 DIAGNOSIS — N814 Uterovaginal prolapse, unspecified: Secondary | ICD-10-CM | POA: Insufficient documentation

## 2022-07-10 LAB — URINALYSIS, ROUTINE W REFLEX MICROSCOPIC
Bilirubin Urine: NEGATIVE
Glucose, UA: NEGATIVE mg/dL
Hgb urine dipstick: NEGATIVE
Ketones, ur: NEGATIVE mg/dL
Leukocytes,Ua: NEGATIVE
Nitrite: NEGATIVE
Protein, ur: NEGATIVE mg/dL
Specific Gravity, Urine: 1.021 (ref 1.005–1.030)
pH: 5 (ref 5.0–8.0)

## 2022-07-10 NOTE — ED Triage Notes (Signed)
Pt states her cervix is "in the wrong spot". Pt states she has to sometimes push it back in. Pt states she has had this problem for over 6 months. Pt states its causing pain and discomfort. Pt is rocking in place and says "it doesn't feel right".

## 2022-07-10 NOTE — Discharge Instructions (Addendum)
You do not have a urinary tract infection that needs to be treated today.  I have attached a few offices for you to see about your uterine prolapse.  [facility]  This is somewhere that you may walk and early next week.  I have attached other offices for you to call as well.

## 2022-07-10 NOTE — ED Provider Notes (Signed)
[facility]-EMERGENCY DEPT Provider Note   CSN: [id number] Arrival date & time: 07/10/22  1559     History  Chief Complaint  Patient presents with   Cervix Pain    Sandra Garza is a 36 y.o. female with a past medical history of substance use disorder presenting today with complaint of pelvic pressure and cervix being outside of her body.  Says that this has been happening intermittently over the last couple months.  Also complaining of dysuria.  HPI     Home Medications Prior to Admission medications   Medication Sig Start Date End Date Taking? Authorizing Provider  FLUoxetine (PROZAC) 20 MG capsule Take 1 capsule (20 mg total) by mouth daily. 01/12/21 02/11/21  Laveda Abbe, NP  gabapentin (NEURONTIN) 100 MG capsule Take 2 capsules (200 mg total) by mouth 3 (three) times daily. 01/12/21   Laveda Abbe, NP  hydrOXYzine (ATARAX/VISTARIL) 25 MG tablet Take 1 tablet (25 mg total) by mouth 3 (three) times daily as needed for anxiety. 01/12/21   Laveda Abbe, NP  lamoTRIgine (LAMICTAL) 25 MG tablet Take 1 tablet (25 mg total) by mouth daily. 01/12/21   Laveda Abbe, NP  traZODone (DESYREL) 50 MG tablet Take 1 tablet (50 mg total) by mouth at bedtime and may repeat dose one time if needed. 01/12/21   Laveda Abbe, NP  amitriptyline (ELAVIL) 25 MG tablet Take 1 tablet (25 mg total) by mouth at bedtime. Patient not taking: Reported on 10/06/2020 07/13/18 10/07/20  Marcello Fennel, MD  lisdexamfetamine (VYVANSE) 20 MG capsule Take 20 mg by mouth daily. Patient not taking: Reported on 10/06/2020  10/07/20  [provider]      Allergies    Ceclor [cefaclor]    Review of Systems   Review of Systems  Physical Exam Updated Vital Signs BP (!) 123/91 (BP Location: Left Arm)   Pulse 68   Temp 98.5 F (36.9 C) (Oral)   Resp 19   SpO2 98%  Physical Exam Vitals and nursing note reviewed.  Constitutional:      Appearance:  Normal appearance.  HENT:     Head: Normocephalic and atraumatic.  Eyes:     General: No scleral icterus.    Conjunctiva/sclera: Conjunctivae normal.  Pulmonary:     Effort: Pulmonary effort is normal. No respiratory distress.  Abdominal:     General: Abdomen is flat.     Palpations: Abdomen is soft.     Tenderness: There is no abdominal tenderness.  Genitourinary:    Comments: GU exam performed in presence of female RN.  Patient with a complete uterine prolapse in the photographs that she has and this is appreciable on physical exam. Skin:    Findings: No rash.  Neurological:     Mental Status: She is alert.  Psychiatric:        Mood and Affect: Mood normal.     ED Results / Procedures / Treatments   Labs (all labs ordered are listed, but only abnormal results are displayed) Labs Reviewed  URINALYSIS, ROUTINE W REFLEX MICROSCOPIC    EKG None  Radiology No results found.  Procedures Procedures   Medications Ordered in ED Medications - No data to display  ED Course/ Medical Decision Making/ A&P                           Medical Decision Making Amount and/or Complexity of Data Reviewed Labs: ordered.  36 year old female presenting today due to uterine prolapse.  This is better appreciated on the photo she has on her cell phone however I am able to see her cervix just inside the vaginal vault.  She has been having a hard time getting in with OB/GYN because they are not taking any new Medicaid patients.  She helped her she came here she would get a referral to see them.  Testing: Complained of dysuria.  UA ordered and is negative.  MDM/disposition: 36 year old female presenting with uterine prolapse.  Likely needs repair or at least a pessary.  We are unable to do that in the department today.  She needs to follow-up with OB/GYN.  I have given her resources and referrals and she will be discharged home.  She is agreeable to the plan.   Final Clinical  Impression(s) / ED Diagnoses Final diagnoses:  Uterine prolapse    Rx / DC Orders ED Discharge Orders     None      Results and diagnoses were explained to the patient. Return precautions discussed in full. Patient had no additional questions and expressed complete understanding.   This chart was dictated using voice recognition software.  Despite best efforts to proofread,  errors can occur which can change the documentation meaning.     Woodroe Chen 07/10/22 1720    Lorre Nick, MD 07/11/22 [phone]
